# Patient Record
Sex: Female | Born: 1975 | Race: White | Hispanic: No | Marital: Married | Smoking: Current every day smoker
Health system: Southern US, Community
[De-identification: ages and names within clinical notes are randomized; demographics above are authoritative.]

## PROBLEM LIST (undated history)

## (undated) DIAGNOSIS — K219 Gastro-esophageal reflux disease without esophagitis: Secondary | ICD-10-CM

## (undated) DIAGNOSIS — E039 Hypothyroidism, unspecified: Secondary | ICD-10-CM

## (undated) DIAGNOSIS — I10 Essential (primary) hypertension: Secondary | ICD-10-CM

## (undated) DIAGNOSIS — F329 Major depressive disorder, single episode, unspecified: Secondary | ICD-10-CM

## (undated) DIAGNOSIS — G2581 Restless legs syndrome: Secondary | ICD-10-CM

## (undated) DIAGNOSIS — I509 Heart failure, unspecified: Secondary | ICD-10-CM

## (undated) DIAGNOSIS — E669 Obesity, unspecified: Secondary | ICD-10-CM

## (undated) DIAGNOSIS — G43909 Migraine, unspecified, not intractable, without status migrainosus: Secondary | ICD-10-CM

## (undated) DIAGNOSIS — O1490 Unspecified pre-eclampsia, unspecified trimester: Secondary | ICD-10-CM

## (undated) DIAGNOSIS — M87059 Idiopathic aseptic necrosis of unspecified femur: Secondary | ICD-10-CM

## (undated) DIAGNOSIS — F419 Anxiety disorder, unspecified: Secondary | ICD-10-CM

## (undated) DIAGNOSIS — M1990 Unspecified osteoarthritis, unspecified site: Secondary | ICD-10-CM

## (undated) DIAGNOSIS — F32A Depression, unspecified: Secondary | ICD-10-CM

## (undated) HISTORY — PX: BREAST LUMPECTOMY: SHX2

## (undated) HISTORY — DX: Gastro-esophageal reflux disease without esophagitis: K21.9

## (undated) HISTORY — DX: Hypothyroidism, unspecified: E03.9

## (undated) HISTORY — DX: Depression, unspecified: F32.A

## (undated) HISTORY — DX: Migraine, unspecified, not intractable, without status migrainosus: G43.909

## (undated) HISTORY — DX: Major depressive disorder, single episode, unspecified: F32.9

## (undated) HISTORY — DX: Essential (primary) hypertension: I10

## (undated) HISTORY — PX: DENTAL SURGERY: SHX609

## (undated) HISTORY — DX: Depression, unspecified: F41.9

## (undated) HISTORY — DX: Restless legs syndrome: G25.81

## (undated) HISTORY — PX: CHOLECYSTECTOMY: SHX55

## (undated) HISTORY — DX: Obesity, unspecified: E66.9

---

## 1998-03-01 ENCOUNTER — Emergency Department (HOSPITAL_COMMUNITY): Admission: EM | Admit: 1998-03-01 | Discharge: 1998-03-01 | Payer: Self-pay | Admitting: Emergency Medicine

## 2002-03-29 HISTORY — PX: GALLBLADDER SURGERY: SHX652

## 2004-07-16 ENCOUNTER — Emergency Department (HOSPITAL_COMMUNITY): Admission: EM | Admit: 2004-07-16 | Discharge: 2004-07-16 | Payer: Self-pay | Admitting: Emergency Medicine

## 2004-10-21 ENCOUNTER — Emergency Department (HOSPITAL_COMMUNITY): Admission: EM | Admit: 2004-10-21 | Discharge: 2004-10-21 | Payer: Self-pay | Admitting: Emergency Medicine

## 2011-12-03 DIAGNOSIS — G2581 Restless legs syndrome: Secondary | ICD-10-CM | POA: Insufficient documentation

## 2011-12-03 DIAGNOSIS — G43019 Migraine without aura, intractable, without status migrainosus: Secondary | ICD-10-CM | POA: Insufficient documentation

## 2012-08-07 ENCOUNTER — Telehealth: Payer: Self-pay | Admitting: Neurology

## 2012-08-07 NOTE — Telephone Encounter (Signed)
Called patient regarding her account and appointment. She stated she will call tomorrow to make a appointment.

## 2012-09-06 ENCOUNTER — Encounter: Payer: Self-pay | Admitting: Neurology

## 2012-09-06 DIAGNOSIS — G2581 Restless legs syndrome: Secondary | ICD-10-CM

## 2012-09-06 DIAGNOSIS — G43019 Migraine without aura, intractable, without status migrainosus: Secondary | ICD-10-CM

## 2012-09-07 ENCOUNTER — Encounter: Payer: Self-pay | Admitting: Neurology

## 2012-09-07 ENCOUNTER — Telehealth: Payer: Self-pay | Admitting: Neurology

## 2012-09-07 ENCOUNTER — Ambulatory Visit (INDEPENDENT_AMBULATORY_CARE_PROVIDER_SITE_OTHER): Payer: BC Managed Care – PPO | Admitting: Neurology

## 2012-09-07 VITALS — BP 105/76 | HR 86 | Ht 66.25 in | Wt 160.0 lb

## 2012-09-07 DIAGNOSIS — G43019 Migraine without aura, intractable, without status migrainosus: Secondary | ICD-10-CM

## 2012-09-07 DIAGNOSIS — G2581 Restless legs syndrome: Secondary | ICD-10-CM

## 2012-09-07 DIAGNOSIS — S139XXS Sprain of joints and ligaments of unspecified parts of neck, sequela: Secondary | ICD-10-CM

## 2012-09-07 MED ORDER — VENLAFAXINE HCL ER 75 MG PO CP24: 75.0000 mg | ORAL_CAPSULE | Freq: Two times a day (BID) | ORAL | Status: DC

## 2012-09-07 MED ORDER — CLONAZEPAM 1 MG PO TABS: 1.0000 mg | ORAL_TABLET | Freq: Three times a day (TID) | ORAL | Status: DC | PRN

## 2012-09-07 NOTE — Telephone Encounter (Signed)
I called the pharmacy and spoke with Pharmacist.  She said the patient just picked up #30 Klonopin 1mg  from her other doctor on 09/04/12.  If she is taking it TID, that would last until 06/19.  She said this patient has a history of trying to get Klonopin, Tramadol and Flexeril filled early.  Pharmacist also indicated they filled the Effexor, but the patient told them she did not want that, she only wanted the Klonopin.  They state patient got very irate with them because they told her they would not fill the medication.  I agreed, and told them we would not approve early refill.  I called the patient.  Advised her she needs to wait until Rx is due to get it refilled.  Told her we do not authorize early refills on controlled substances.  She verbalized understanding.

## 2012-09-07 NOTE — Progress Notes (Signed)
Reason for visit: Headache  Lindsey Griffin is an 37 y.o. female  History of present illness:  Lindsey Griffin is a 37 year old left-handed white female with a history of chronic daily headaches. The patient indicates that she continues to have ongoing headaches daily, and the headaches tend to come up from the neck and shoulders to the back of the head, and then spread forward. The patient has not had any days without headache since last seen. The patient continues to have significant issues with anxiety, and depression. The patient is having daily panic attacks, and the clonazepam dose was recently increased taking 1 mg 3 times daily. The patient is on Effexor taking 37.5 mg twice daily. The patient is not sleeping well at night. The patient does report a lot of neck stiffness and shoulder discomfort. The patient returns for an evaluation. The patient indicates that she is unable to function outside the house 25 days out of a month. The patient reports intermittent episodes of brief amnesia associated with the headache.  Past Medical History  Diagnosis Date  . Migraine headache   . Mild obesity   . Hypertension   . GERD (gastroesophageal reflux disease)   . Hypothyroidism   . RLS (restless legs syndrome)   . Anxiety and depression     Past Surgical History  Procedure Laterality Date  . Gallbladder surgery      Family History  Problem Relation Age of Onset  . Hypothyroidism Mother   . Headache Mother   . Hypertension Father   . Migraines Sister   . Stroke Brother     Social history:  reports that she has been smoking Cigarettes.  She has been smoking about 0.50 packs per day. She does not have any smokeless tobacco history on file. She reports that  drinks alcohol. She reports that she does not use illicit drugs.  Allergies: No Known Allergies  Medications:  Current Outpatient Prescriptions on File Prior to Visit  Medication Sig Dispense Refill  . acetaminophen (TYLENOL) 500 MG  tablet Take 500 mg by mouth every 6 (six) hours as needed for pain.      . Coenzyme Q10 (CO Q 10) 100 MG CAPS Take 100 capsules by mouth daily.      . fish oil-omega-3 fatty acids 1000 MG capsule Take 2 g by mouth daily.      Marland Kitchen gabapentin (NEURONTIN) 300 MG capsule Take 300 mg by mouth 3 (three) times daily.      Marland Kitchen levothyroxine (SYNTHROID, LEVOTHROID) 100 MCG tablet Take 100 mcg by mouth daily before breakfast.      . Magnesium Oxide 250 MG TABS Take 250 tablets by mouth daily.      . naproxen sodium (ANAPROX) 220 MG tablet Take 220 mg by mouth 2 (two) times daily with a meal.      . omeprazole (PRILOSEC) 20 MG capsule Take 20 mg by mouth daily.      . promethazine (PHENERGAN) 25 MG tablet Take 25 mg by mouth 2 (two) times daily.      . Riboflavin (B-2-400) 400 MG CAPS Take 400 capsules by mouth daily.      . rizatriptan (MAXALT) 10 MG tablet Take 10 mg by mouth as needed for migraine. May repeat in 2 hours if needed      . traMADol (ULTRAM) 50 MG tablet Take 50 mg by mouth every 6 (six) hours as needed for pain.      . valsartan-hydrochlorothiazide (DIOVAN-HCT) 160-25 MG per tablet  Take 1 tablet by mouth daily.       No current facility-administered medications on file prior to visit.    ROS:  Out of a complete 14 system review of symptoms, the patient complains only of the following symptoms, and all other reviewed systems are negative.  Weight gain, fatigue Palpitations of the heart Hearing loss, ringing in the ears, dizziness Itching Blurred vision, double vision, loss of vision Shortness of breath, cough, wheezing, snoring Diarrhea Urination problems Memory loss, confusion, headache, weakness, slurred speech Depression, anxiety, insomnia, change in appetite, just interest in activities Restless legs  Blood pressure 105/76, pulse 86, height 5' 6.25" (1.683 m), weight 160 lb (72.576 kg).  Physical Exam  General: The patient is alert and cooperative at the time of the  examination.  Skin: No significant peripheral edema is noted.   Neurologic Exam  Cranial nerves: Facial symmetry is present. Speech is normal, no aphasia or dysarthria is noted. Extraocular movements are full. Visual fields are full.  Motor: The patient has good strength in all 4 extremities.  Coordination: The patient has good finger-nose-finger and heel-to-shin bilaterally.  Gait and station: The patient has a normal gait. Tandem gait is normal. Romberg is negative. No drift is seen.  Reflexes: Deep tendon reflexes are symmetric.   Assessment/Plan:  1. Depression, panic disorder  2. Chronic daily headache  The patient primarily has psychiatric disease associated with depression and anxiety. The chronic daily headaches are a manifestation of her underlying psychiatric problems. The patient has been increased on the clonazepam taking 1 mg 3 times daily. The patient will be increased on the Effexor taking 75 mg twice daily. The patient may require a psychiatric evaluation if the headaches do not improve along with anxiety and her panic disorder. The patient will followup in 4 months. The patient in the past has not responded to Botox. The patient will be set up for neuromuscular therapy on the neck and shoulders.  Marlan Palau MD 09/07/2012 5:03 PM  [facility] [address] [place], [place] [address]  Phone [phone] Fax [phone]

## 2012-09-08 DIAGNOSIS — Z0289 Encounter for other administrative examinations: Secondary | ICD-10-CM

## 2012-12-29 ENCOUNTER — Other Ambulatory Visit: Payer: Self-pay | Admitting: Neurology

## 2013-01-01 ENCOUNTER — Other Ambulatory Visit: Payer: Self-pay

## 2013-01-01 MED ORDER — CLONAZEPAM 1 MG PO TABS: 1.0000 mg | ORAL_TABLET | Freq: Three times a day (TID) | ORAL | Status: DC | PRN

## 2013-01-08 ENCOUNTER — Ambulatory Visit: Payer: BC Managed Care – PPO | Admitting: Nurse Practitioner

## 2013-01-26 ENCOUNTER — Ambulatory Visit: Payer: BC Managed Care – PPO | Admitting: Nurse Practitioner

## 2013-02-08 ENCOUNTER — Ambulatory Visit: Payer: BC Managed Care – PPO | Admitting: *Deleted

## 2013-02-08 ENCOUNTER — Encounter: Payer: Self-pay | Admitting: Nurse Practitioner

## 2013-02-08 ENCOUNTER — Ambulatory Visit (INDEPENDENT_AMBULATORY_CARE_PROVIDER_SITE_OTHER): Payer: BC Managed Care – PPO | Admitting: Nurse Practitioner

## 2013-02-08 ENCOUNTER — Encounter (INDEPENDENT_AMBULATORY_CARE_PROVIDER_SITE_OTHER): Payer: Self-pay

## 2013-02-08 VITALS — BP 111/80 | HR 120 | Ht 67.0 in | Wt 157.0 lb

## 2013-02-08 DIAGNOSIS — G2581 Restless legs syndrome: Secondary | ICD-10-CM

## 2013-02-08 DIAGNOSIS — G43019 Migraine without aura, intractable, without status migrainosus: Secondary | ICD-10-CM

## 2013-02-08 MED ORDER — SODIUM CHLORIDE 0.9 % IV SOLN
250.0000 mg | INTRAVENOUS | Status: AC
  Administered 2013-02-08: 250 mg via INTRAVENOUS

## 2013-02-08 MED ORDER — SODIUM CHLORIDE 0.9 % IV SOLN: 250.0000 mg | INTRAVENOUS | Status: DC

## 2013-02-08 MED ORDER — PREDNISONE 10 MG PO TABS: 10.0000 mg | ORAL_TABLET | Freq: Every day | ORAL | Status: DC

## 2013-02-08 MED ORDER — PROMETHAZINE HCL 25 MG/ML IJ SOLN
25.0000 mg | Freq: Once | INTRAMUSCULAR | Status: AC
  Administered 2013-02-08: 25 mg via INTRAMUSCULAR

## 2013-02-08 MED ORDER — SODIUM CHLORIDE 0.9 % IV SOLN
500.0000 mL | INTRAVENOUS | Status: DC
  Administered 2013-02-08: 500 mL via INTRAVENOUS

## 2013-02-08 NOTE — Patient Instructions (Signed)
Will give IV fluids today Will given Phenergan IM for nausea Will give 250mg  Solu Medrol IV Will call in dose pack of Prednisone Will refer to Dr. Pearson Grippe at [facility]

## 2013-02-08 NOTE — Progress Notes (Signed)
[facility]  PATIENT: Lindsey Griffin DOB: 1975-04-14   REASON FOR VISIT: Migraines for 5 days / nausea vomiting   HISTORY OF PRESENT ILLNESS:Lindsey Griffin, 37 year old female with a history of chronic daily headaches returns for followup. She was last seen in this office 09/07/2012 by Dr. Anne Hahn and has been followed by him for several years for headache. The patient has tried multiple preventatives in the past. The patient indicates that she continues to have ongoing headaches daily, and the headaches tend to come up from the neck and shoulders to the back of the head, and then spread forward. The patient has had a few  days without headache since last seen. The patient continues to have significant issues with anxiety, and depression. She has never had any counseling .The patient is having  panic attacks, and is on  clonazepam . The patient is on Effexor taking 75mg  twice daily. The patient is not sleeping well at night.  The patient indicates that she is unable to function outside the house 25 days out of a month. She has been on Topamax, Cymbalta, atenolol, amitriptyline, Depakote, Botox, Imitrex injections, Treximet, Zomig, Seroquel and Depacon with no benefit. She is vomiting clear liquid   REVIEW OF SYSTEMS: Full 14 system review of systems performed and notable only for:  Constitutional: N/A  Cardiovascular: N/A  Ear/Nose/Throat: N/A  Skin: N/A  Eyes: N/A  Respiratory: N/A  Gastroitestinal: N/A  Hematology/Lymphatic: N/A  Endocrine: N/A Musculoskeletal:N/A  Allergy/Immunology: N/A  Neurological: N/A Psychiatric: N/A   ALLERGIES: No Known Allergies  HOME MEDICATIONS: Outpatient Prescriptions Prior to Visit  Medication Sig Dispense Refill  . acetaminophen (TYLENOL) 500 MG tablet Take 500 mg by mouth every 6 (six) hours as needed for pain.      . clonazePAM (KLONOPIN) 1 MG tablet Take 1 tablet (1 mg total) by mouth 3 (three) times daily as needed.  90 tablet   3  . Coenzyme Q10 (CO Q 10) 100 MG CAPS Take 100 capsules by mouth daily.      Marland Kitchen gabapentin (NEURONTIN) 300 MG capsule Take 300 mg by mouth 3 (three) times daily.      Marland Kitchen levothyroxine (SYNTHROID, LEVOTHROID) 100 MCG tablet Take 100 mcg by mouth daily before breakfast.      . Magnesium Oxide 250 MG TABS Take 250 tablets by mouth daily.      . naproxen sodium (ANAPROX) 220 MG tablet Take 220 mg by mouth 2 (two) times daily with a meal.      . promethazine (PHENERGAN) 25 MG tablet Take 25 mg by mouth 2 (two) times daily.      . Riboflavin (B-2-400) 400 MG CAPS Take 400 capsules by mouth daily.      . rizatriptan (MAXALT) 10 MG tablet Take 10 mg by mouth as needed for migraine. May repeat in 2 hours if needed      . traMADol (ULTRAM) 50 MG tablet Take 50 mg by mouth every 6 (six) hours as needed for pain.      . valsartan-hydrochlorothiazide (DIOVAN-HCT) 160-25 MG per tablet Take 1 tablet by mouth daily.      Marland Kitchen venlafaxine XR (EFFEXOR-XR) 75 MG 24 hr capsule Take 1 capsule (75 mg total) by mouth 2 (two) times daily.  60 capsule  5  . fish oil-omega-3 fatty acids 1000 MG capsule Take 2 g by mouth daily.      Marland Kitchen omeprazole (PRILOSEC) 20 MG capsule Take 20 mg by mouth daily.  No facility-administered medications prior to visit.    PAST MEDICAL HISTORY: Past Medical History  Diagnosis Date  . Migraine headache   . Mild obesity   . Hypertension   . GERD (gastroesophageal reflux disease)   . Hypothyroidism   . RLS (restless legs syndrome)   . Anxiety and depression     PAST SURGICAL HISTORY: Past Surgical History  Procedure Laterality Date  . Gallbladder surgery      FAMILY HISTORY: Family History  Problem Relation Age of Onset  . Hypothyroidism Mother   . Headache Mother   . Hypertension Father   . Migraines Sister   . Stroke Brother     SOCIAL HISTORY: History   Social History  . Marital Status: Divorced    Spouse Name: N/A    Number of Children: 1  . Years of  Education: 14   Occupational History  . Unemployed    Social History Main Topics  . Smoking status: Current Every Day Smoker -- 0.50 packs/day    Types: Cigarettes  . Smokeless tobacco: Never Used  . Alcohol Use: Yes     Comment: Consumes 2 or 3 alcholic beverages per week  . Drug Use: No  . Sexual Activity: Not on file   Other Topics Concern  . Not on file   Social History Narrative  . No narrative on file     PHYSICAL EXAM  Filed Vitals:   02/08/13 1020  BP: 111/80  Pulse: 120  Height: 5\' 7"  (1.702 m)  Weight: 157 lb (71.215 kg)   Body mass index is 24.58 kg/(m^2).  Generalized: Well developed, in no acute distress  Head: normocephalic and atraumatic,. Oropharynx benign  Musculoskeletal: No deformity   Neurological examination   Mentation: Alert oriented to time, place, history taking. Follows all commands speech and language fluent  Cranial nerve II-XII: Fundoscopic exam deferred.Pupils were equal round reactive to light extraocular movements were full, visual field were full on confrontational test. Facial sensation and strength were normal. hearing was intact to finger rubbing bilaterally. Uvula tongue midline. head turning and shoulder shrug and were normal and symmetric.Tongue protrusion into cheek strength was normal. Motor: normal bulk and tone, full strength in the BUE, BLE, fine finger movements normal, no pronator drift. No focal weakness Coordination: finger-nose-finger,  no dysmetria Reflexes: Brachioradialis 2/2, biceps 2/2, triceps 2/2, patellar 2/2, Achilles 2/2, plantar responses were flexor bilaterally. Gait and Station: Not ambulated, vomiting.   DIAGNOSTIC DATA (LABS, IMAGING, TESTING) -None to review ASSESSMENT AND PLAN  37 y.o. year old female  has a past medical history of Migraine headache; Mild obesity; Hypertension; GERD (gastroesophageal reflux disease); Hypothyroidism; RLS (restless legs syndrome); and Anxiety and depression. here to  followup for severe migraine for 5 days with nausea and vomiting. She has been on Topamax, Cymbalta, atenolol, amitriptyline, Depakote, Botox, Imitrex injections, Treximet, Zomig, Seroquel and Depacon with no benefit.  Discussed with Dr. Anne Hahn Will give IV fluids today, 1000cc NS Will given Phenergan IM for nausea Will give 250mg  Solu Medrol IV Will call in dose pack of Prednisone 6day  Stop OTC meds.  Will refer to Dr. Pearson Grippe at [facility] Made aware she needs to seek mental health counseling in her county [place] [place], [facility], [facility], [facility]  [facility] [address] [place], [place] [address] [phone]

## 2013-02-08 NOTE — Progress Notes (Signed)
I have read the note, and I agree with the clinical assessment and plan.  WILLIS,CHARLES KEITH   

## 2013-02-08 NOTE — Progress Notes (Signed)
Pt here for appt.  Darrol Angel, NP ordered IVF 0.9% NS Bolus, Phenergan 25mg  IM, Solumedrol 250mg  IV for treatment of migraine.  IV 24g angiocath inserted to R forearm with good blood return, would not flow, taken out, 2nd attempt to L outer AC with good blood return infusion NS started , patent .  Gave phenergan 25mg  IM to L gluteal at 1137,  bandaid applied pt tolerated well.   Pt made comfortable, reclining in chair, lights dimmed, blanket applied, husband brought to treatment room  at 1149.  IV solumedrol 250mg  given at 1200.  Informed of possible SE.  At 1223 pt asleep,  Pt instructed to keep arm straight, repositioned for IV flow.  At 1238, pt asleep , IV patent.  1249 Pt and husband asleep, IV patent.  1258 IV patent, both pt and husband asleep.  1319 IV finished, IV discontinued.  Bandaid applied after catheter removal and pressure applied.  Vs 102/64, HR 100.  Pt groggy, accompanied pt to car in wheelchair. Husband driving.  Pt stated head still pounding.  Assisted pt to car, seat belt placed and pt reclining in car.  She was informed re: if needed tramadol to contact pcp, whom she gets prescription from.

## 2013-05-02 ENCOUNTER — Other Ambulatory Visit: Payer: Self-pay | Admitting: Neurology

## 2013-05-02 NOTE — Telephone Encounter (Signed)
Rx signed and faxed.

## 2013-06-29 ENCOUNTER — Encounter: Payer: Self-pay | Admitting: *Deleted

## 2013-07-02 ENCOUNTER — Encounter: Payer: Self-pay | Admitting: Neurology

## 2013-07-02 ENCOUNTER — Encounter (INDEPENDENT_AMBULATORY_CARE_PROVIDER_SITE_OTHER): Payer: Self-pay

## 2013-07-02 ENCOUNTER — Ambulatory Visit (INDEPENDENT_AMBULATORY_CARE_PROVIDER_SITE_OTHER): Payer: BC Managed Care – PPO | Admitting: Neurology

## 2013-07-02 VITALS — BP 118/87 | HR 92 | Resp 15 | Ht 67.0 in | Wt 159.0 lb

## 2013-07-02 DIAGNOSIS — R0989 Other specified symptoms and signs involving the circulatory and respiratory systems: Secondary | ICD-10-CM

## 2013-07-02 DIAGNOSIS — R0683 Snoring: Secondary | ICD-10-CM

## 2013-07-02 DIAGNOSIS — G43019 Migraine without aura, intractable, without status migrainosus: Secondary | ICD-10-CM

## 2013-07-02 DIAGNOSIS — F411 Generalized anxiety disorder: Secondary | ICD-10-CM

## 2013-07-02 DIAGNOSIS — R0609 Other forms of dyspnea: Secondary | ICD-10-CM

## 2013-07-02 DIAGNOSIS — G4720 Circadian rhythm sleep disorder, unspecified type: Secondary | ICD-10-CM

## 2013-07-02 NOTE — Progress Notes (Addendum)
[facility]  Provider:  Melvyn Novasarmen  Courtenay Creger, M D  Referring Provider: Kirstie PeriShah, Ashish, MD Primary Care Physician:  Kirstie PeriSHAH,ASHISH, MD    HPI:  Lindsey Griffin is a 38 y.o. caucasian , right handed , married female . She  is seen here upon a referral from Dr.Finkel , for a sleep evaluation . The consult is meant to rule out sleep disorders as a possible contributing factor to her chronic daily migraines.   The patient is seen here today upon referral by Dr. Wandra ArthursAlan Finkel or had last seen the patient on 06-09-13. Is not state that the patient has a total headache per month of 28 of 30 days severe headaches of 18 days moderate headaches 5-10 days. Days without headache last months at best 2 days. Her symptoms have been associated with nausea vomiting photophobia, phonophobia, osmophobia, neck pain, dizziness she also feels that her headache is worsening with any activity. A headache at Last 24 hours or up to 24 hours. The headache is described as localized in both sides of the bowel in the frontal area in the back of the head and portal cephalic. Throbbing pressure and tightness are descriptive qualities. Again the symptoms are aggravated by Smeltz, bright light, standing, menstrual cycle lower noises blocking exercise, and stressful situations. Dr. Marshell GarfinkelFinkel saw the patient upon request of Dr. Anne HahnWillis.  Lindsey Griffin states,  she can recall severe  headaches as far as 5 or 6 years back . She would often vomit ond have photo- sensitivities , auras  prior to the headache worsening. In 2008 she noted a radical increase and worsening of the vomiting which eventually led to her current disability.  She underwent a desensitization for food triggers  with improvement for about 1 year,  but 7 years ago it all began to come apart and what used to be a headache every couple of months has become a monthly then weekly and now almost daily events. She was again seen by Dr. Anne HahnWillis at that time.  The patient's family  history is remarkable for headache and her mother sisters and an her daughter, family history is also remarkable for stroke in a brother at age 37.   In addition the patient has now  circadian rhythm disorder . Characterized as the  reversal of sleep into the day from previously nighttime sleep.  She states that since being on disability this has been slowly evolving. She goes to bed only when tired , about 6 AM after her husband leaves for work. Her legs move a lot and she fears this would wake him. She takes a hot bath before bedtime, and she falls asleep after 30- 45 minutes. She cannot stay asleep, goes every hour to the bathroom and up to 5 times at night. Total sleep time during a 24 hour period is 4-5 hours. She wakes up in the early afternoon, 1 -2 PM, she uses no alarm. Her husband gets hone with their daughter at about 3 Pm and she is awake by then.  Her headaches wake her out of sleep .      She also notices it sometimes talks in her sleep she also reports restless limb movements in spite of taking Mirapex. She has a history of using gabapentin and clonazepam for anxiety and panic attack.  Review of Systems: Out of a complete 14 system review, the patient complains of only the following symptoms, and all other reviewed systems are negative.  Her review of system is positive for  a life quality impairment due to restless legs endorsed at 8-9/10 points on the [facility] questionnaire, a fatigue severity score of 54 points and an Epworth sleepiness score of 5 points. The use of systems as obtained by Dr. Marshell Garfinkel to 3 weeks ago still applies, appetite loss fatigue and weight gain, blurred vision visual loss double vision, shortness of breath, abdominal pain, nausea, muscle cramps, personality change, depression, anxiety, heat intolerance, night sweats.      History   Social History  . Marital Status: Married    Spouse Name: Lindsey Griffin    Number of Children: 1  . Years of Education: 14    Occupational History  . Unemployed    Social History Main Topics  . Smoking status: Current Every Day Smoker -- 0.50 packs/day    Types: Cigarettes  . Smokeless tobacco: Never Used  . Alcohol Use: Yes     Comment: Consumes 2 or 3 alcholic beverages per month  . Drug Use: No  . Sexual Activity: Not on file   Other Topics Concern  . Not on file   Social History Narrative   Patient is married Dance movement psychotherapist)    Patient has a Naval architect.   Patient is disabled.   Patient has one child.   Patient is left-handed.   Patient does not drink any caffeine.             Family History  Problem Relation Age of Onset  . Headache Mother   . Hypertension Father   . Migraines Sister   . Stroke Brother   . Hyperthyroidism Mother   . Hypercholesterolemia Father   . Heart attack Father   . Asthma Father   . COPD Mother   . Hypertension Mother   . Uterine cancer Mother   . Hypothyroidism Sister   . Hypertension Sister   . Hypertension Sister   . Fibroids Sister   . Bipolar disorder Sister   . Paranoid behavior Sister   . Fibroids Sister     Past Medical History  Diagnosis Date  . Migraine headache   . Mild obesity   . Hypertension   . GERD (gastroesophageal reflux disease)   . Hypothyroidism   . RLS (restless legs syndrome)   . Anxiety and depression     Past Surgical History  Procedure Laterality Date  . Gallbladder surgery  2004    Current Outpatient Prescriptions  Medication Sig Dispense Refill  . acetaminophen (TYLENOL) 500 MG tablet Take 500 mg by mouth every 6 (six) hours as needed for pain.      . clonazePAM (KLONOPIN) 1 MG tablet take 1 tablet by mouth three times a day if needed for anxiety  90 tablet  3  . Coenzyme Q10 (CO Q 10) 100 MG CAPS Take 100 capsules by mouth daily.      . famotidine (PEPCID) 10 MG tablet Take 10 mg by mouth 2 (two) times daily.      Marland Kitchen levothyroxine (SYNTHROID, LEVOTHROID) 100 MCG tablet Take 100 mcg by mouth daily before  breakfast.      . Magnesium Oxide 250 MG TABS Take 250 tablets by mouth daily.      . mirtazapine (REMERON) 15 MG tablet Take 15 mg by mouth at bedtime.      . naproxen sodium (ANAPROX) 220 MG tablet Take 220 mg by mouth 2 (two) times daily with a meal.      . ondansetron (ZOFRAN) 8 MG tablet Take by mouth every 8 (eight) hours  as needed for nausea or vomiting.      . promethazine (PHENERGAN) 25 MG tablet Take 25 mg by mouth 2 (two) times daily.      . Riboflavin (B-2-400) 400 MG CAPS Take 400 capsules by mouth daily.      . rizatriptan (MAXALT) 10 MG tablet Take 10 mg by mouth as needed for migraine. May repeat in 2 hours if needed      . traMADol (ULTRAM) 50 MG tablet Take 50 mg by mouth every 6 (six) hours as needed for pain.      . valsartan-hydrochlorothiazide (DIOVAN-HCT) 160-25 MG per tablet Take 1 tablet by mouth daily.       Current Facility-Administered Medications  Medication Dose Route Frequency Provider Last Rate Last Dose  . 0.9 %  sodium chloride infusion  500 mL Intravenous Continuous York Spaniel, MD   500 mL at 02/08/13 1130  . methylPREDNISolone sodium succinate (SOLU-MEDROL) 250 mg in sodium chloride 0.9 % 100 mL IVPB  250 mg Intravenous Continuous York Spaniel, MD   250 mg at 02/08/13 1200    Allergies as of 07/02/2013 - Review Complete 07/02/2013  Allergen Reaction Noted  . Dust mite extract  06/29/2013  . Peanuts [peanut oil]  06/29/2013  . Pollen extract  06/29/2013  . Topamax [topiramate]  06/29/2013    Vitals: BP 118/87  Pulse 92  Resp 15  Ht 5\' 7"  (1.702 m)  Wt 159 lb (72.122 kg)  BMI 24.90 kg/m2  LMP 06/01/2013 Last Weight:  Wt Readings from Last 1 Encounters:  07/02/13 159 lb (72.122 kg)   Last Height:   Ht Readings from Last 1 Encounters:  07/02/13 5\' 7"  (1.702 m)   Physical exam:  General: The patient is awake, alert and appears not in acute distress. The patient is well groomed. She wears sunglasses .  Head: Normocephalic,  atraumatic. Neck is supple. Mallampati 2 , neck circumference: 15.5 ,  TMJ clicking   Cardiovascular:  Regular rate and rhythm , without  murmurs or carotid bruit, and without distended neck veins. Respiratory: Lungs are clear to auscultation. Skin:  Without evidence of edema, or rash Trunk: BMI is normal .  Neurologic exam : The patient is awake and alert, oriented to place and time.  Memory subjective  described as intact. There is a normal attention span & concentration ability. Speech is fluent without  dysarthria, dysphonia or aphasia. Mood and affect are depressed.  Cranial nerves: Pupils are equal and briskly reactive to light. Funduscopic exam without evidence of pallor or edema. Extraocular movements  in vertical and horizontal planes intact and without nystagmus. Visual fields by finger perimetry are intact. Hearing to finger rub intact.  Facial sensation intact to fine touch. Facial motor strength is symmetric and tongue and uvula move midline.  Motor exam:  Normal tone , muscle bulk and symmetric normal strength in all extremities.  Coordination: Rapid alternating movements in the fingers/hands is tested and normal. Finger-to-nose maneuver without evidence of ataxia, dysmetria or tremor.  Gait and station: Patient walks without assistive device .  Assessment:  After physical and neurologic examination, review of laboratory studies, imaging, neurophysiology testing and pre-existing records, assessment is : circadian rhytm disorder , possibly in context with a mood disorder, poor sleep hygiene and medication use. Psychological factors are prominent .  Plan:  Treatment plan and additional workup :  I will offer this patient a daytime study to accommodate her current sleep rhythm, and to get any sleep  recording.  Given the long standing history of daily headaches a sleep disorder as the cause of her migraines and headaches.  CO2 recording needed. SPLIT at AHI 15 and 3% score for  patient on  BCBS / disability.   She will bring her acid reducer and Extra pillows ,  As well as her sleep aid of choice ( benadryl)  will be brought to the lab by the patient.    Addendum : last note by Dr Anne Hahn. Nov 2014.  Reason for visit: Headache  History of present illness:Lindsey Griffin is a 38 year old left-handed white female with a history of chronic daily headaches. The patient indicates that she continues to have ongoing headaches daily, and the headaches tend to come up from the neck and shoulders to the back of the head, and then spread forward. The patient has not had any days without headache since last seen. The patient continues to have significant issues with anxiety, and depression. The patient is having daily panic attacks, and the clonazepam dose was recently increased taking 1 mg 3 times daily. The patient is on Effexor taking 37.5 mg twice daily. The patient is not sleeping well at night. The patient does report a lot of neck stiffness and shoulder discomfort. The patient returns for an evaluation. The patient indicates that she is unable to function outside the house 25 days out of a month. The patient reports intermittent episodes of brief amnesia associated with the headache.  Past Medical History  Diagnosis Date  . Migraine headache   . Mild obesity   . Hypertension   . GERD (gastroesophageal reflux disease)   . Hypothyroidism   . RLS (restless legs syndrome)   . Anxiety and depression      Social history:  reports that she has been smoking Cigarettes.  She has been smoking about 0.50 packs per day. She has never used smokeless tobacco. She reports that she drinks alcohol. She reports that she does not use illicit drugs.   Blood pressure 118/87, pulse 92, resp. rate 15, height 5\' 7"  (1.702 m), weight 159 lb (72.122 kg), last menstrual period 06/01/2013.  Physical Exam  General: The patient is alert and cooperative at the time of the examination.  Skin: No  significant peripheral edema is noted.   Neurologic Exam Cranial nerves: Facial symmetry is present. Speech is normal, no aphasia or dysarthria is noted. Extraocular movements are full. Visual fields are full. Motor: The patient has good strength in all 4 extremities. Coordination: The patient has good finger-nose-finger and heel-to-shin bilaterally. Gait and station: The patient has a normal gait. Tandem gait is normal. Romberg is negative. No drift is seen. Reflexes: Deep tendon reflexes are symmetric.   Assessment/Plan:  1. Depression, panic disorder 2. Chronic daily headache The patient primarily has psychiatric disease associated with depression and anxiety. The chronic daily headaches are a manifestation of her underlying psychiatric problems. The patient has been increased on the clonazepam taking 1 mg 3 times daily. The patient will be increased on the Effexor taking 75 mg twice daily. The patient may require a psychiatric evaluation if the headaches do not improve along with anxiety and her panic disorder. The patient will followup in 4 months. The patient in the past has not responded to Botox. The patient will be set up for neuromuscular therapy on the neck and shoulders.  Marlan Palau MD  [facility] [address] [place], [place] [address]  Phone [phone] Fax [phone]

## 2013-07-02 NOTE — Patient Instructions (Signed)
Headaches, Analgesic Rebound Analgesic agents are prescription or over-the-counter medications used to control pain, including headaches. However, overuse or misuse of theses medications can lead to rebound headaches. Rebound headaches are headaches that recur after the analgesic medication wears off. Eventually, the rebound headaches can become longlasting (chronic). If this happens, you must completely stop using analgesic medications. If not, the chronic headache is likely to continue despite the use of any other treatment. Usually when you stop taking analgesic medications, the headache may initally get worse for several days. Along with this you may experience sickness in your stomach (nausea), and you may throw up (vomit). After a period of 3 to 5 days, these symptoms begin to improve. Sometimes improvement may take longer. Eventually, the headaches will slowly improve with treatment with the right medications. Most people are able to stop using analgesic medications at home with a caregiver's supervision. But some find it difficult and may require hospitalization. Document Released: 06/05/2003 Document Revised: 06/07/2011 Document Reviewed: 09/28/2012 [employer] Patient Information 2014 [employer].  

## 2013-08-23 ENCOUNTER — Other Ambulatory Visit: Payer: Self-pay

## 2013-08-23 MED ORDER — CLONAZEPAM 1 MG PO TABS: ORAL_TABLET | ORAL | Status: DC

## 2013-08-23 NOTE — Telephone Encounter (Signed)
Rx signed and faxed.

## 2014-06-14 ENCOUNTER — Ambulatory Visit (INDEPENDENT_AMBULATORY_CARE_PROVIDER_SITE_OTHER): Payer: 59 | Admitting: Psychiatry

## 2014-06-14 ENCOUNTER — Encounter (HOSPITAL_COMMUNITY): Payer: Self-pay | Admitting: Psychiatry

## 2014-06-14 VITALS — BP 113/89 | HR 69 | Ht 66.0 in | Wt 152.4 lb

## 2014-06-14 DIAGNOSIS — F4310 Post-traumatic stress disorder, unspecified: Secondary | ICD-10-CM | POA: Diagnosis not present

## 2014-06-14 MED ORDER — CLONAZEPAM 1 MG PO TABS: 1.0000 mg | ORAL_TABLET | Freq: Three times a day (TID) | ORAL | Status: DC

## 2014-06-14 MED ORDER — PRAZOSIN HCL 5 MG PO CAPS: 5.0000 mg | ORAL_CAPSULE | Freq: Every day | ORAL | Status: DC

## 2014-06-14 MED ORDER — SUVOREXANT 20 MG PO TABS: 20.0000 mg | ORAL_TABLET | Freq: Every day | ORAL | Status: DC
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
## 2014-06-26 ENCOUNTER — Telehealth (HOSPITAL_COMMUNITY): Payer: Self-pay | Admitting: *Deleted

## 2014-06-26 NOTE — Telephone Encounter (Signed)
Per Dr. Tenny Crawoss, she will not be able to increase pt medication until she see pt in at sooner appt. Called pt and made appt with pt. Offered pt sooner appt but pt did not want those times and date due to her husband have to bring her and her not having transportation. Per pt she will come in April 4th. Pt is aware that Dr. Tenny Crawoss is also unable to make increase her medications until she is seen and pt agreed and showed understanding

## 2014-06-26 NOTE — Telephone Encounter (Signed)
phone call from patient wants Klonopin increased, she keeps thinking about dying.    She will run out pretty soon.

## 2014-06-26 NOTE — Telephone Encounter (Signed)
Please call patient to be seen sooner. I cannot refill controlled meds early

## 2014-07-01 ENCOUNTER — Ambulatory Visit (HOSPITAL_COMMUNITY): Payer: Self-pay | Admitting: Psychiatry

## 2014-07-04 ENCOUNTER — Ambulatory Visit (INDEPENDENT_AMBULATORY_CARE_PROVIDER_SITE_OTHER): Payer: 59 | Admitting: Psychiatry

## 2014-07-04 ENCOUNTER — Encounter (HOSPITAL_COMMUNITY): Payer: Self-pay | Admitting: Psychiatry

## 2014-07-04 VITALS — BP 140/90 | Ht 67.0 in | Wt 152.0 lb

## 2014-07-04 DIAGNOSIS — F4310 Post-traumatic stress disorder, unspecified: Secondary | ICD-10-CM

## 2014-07-04 MED ORDER — CLONAZEPAM 1 MG PO TABS: 1.0000 mg | ORAL_TABLET | Freq: Three times a day (TID) | ORAL | Status: DC
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
## 2014-07-08 ENCOUNTER — Ambulatory Visit (HOSPITAL_COMMUNITY): Payer: Self-pay | Admitting: Psychiatry

## 2014-07-09 ENCOUNTER — Ambulatory Visit (HOSPITAL_COMMUNITY): Payer: Self-pay | Admitting: Psychiatry

## 2014-07-29 ENCOUNTER — Ambulatory Visit (HOSPITAL_COMMUNITY): Payer: Self-pay | Admitting: Psychiatry

## 2014-07-30 ENCOUNTER — Ambulatory Visit (HOSPITAL_COMMUNITY): Payer: Self-pay | Admitting: Psychiatry

## 2014-08-14 ENCOUNTER — Ambulatory Visit (HOSPITAL_COMMUNITY): Payer: Self-pay | Admitting: Psychiatry

## 2014-08-20 ENCOUNTER — Ambulatory Visit (HOSPITAL_COMMUNITY): Payer: Self-pay | Admitting: Psychiatry

## 2014-09-11 ENCOUNTER — Encounter (HOSPITAL_COMMUNITY): Payer: Self-pay | Admitting: Psychiatry

## 2014-09-11 ENCOUNTER — Ambulatory Visit (INDEPENDENT_AMBULATORY_CARE_PROVIDER_SITE_OTHER): Payer: 59 | Admitting: Psychiatry

## 2014-09-11 VITALS — BP 90/66 | HR 92 | Ht 67.0 in | Wt 150.4 lb

## 2014-09-11 DIAGNOSIS — F4310 Post-traumatic stress disorder, unspecified: Secondary | ICD-10-CM | POA: Diagnosis not present

## 2014-09-11 MED ORDER — CLONAZEPAM 1 MG PO TABS: 1.0000 mg | ORAL_TABLET | Freq: Three times a day (TID) | ORAL | Status: DC

## 2014-09-11 MED ORDER — SUVOREXANT 20 MG PO TABS: 20.0000 mg | ORAL_TABLET | ORAL | Status: DC | PRN
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
## 2014-09-17 ENCOUNTER — Ambulatory Visit (HOSPITAL_COMMUNITY): Payer: Self-pay | Admitting: Psychiatry

## 2014-10-03 ENCOUNTER — Telehealth (HOSPITAL_COMMUNITY): Payer: Self-pay | Admitting: *Deleted

## 2014-10-03 NOTE — Telephone Encounter (Signed)
Prior authorization for Belsomra received. Submitted online with cover my meds. Decision will be faxed.

## 2014-10-08 ENCOUNTER — Telehealth (HOSPITAL_COMMUNITY): Payer: Self-pay | Admitting: *Deleted

## 2014-10-08 NOTE — Telephone Encounter (Signed)
FAX DENIAL FOR BELSOMRA.

## 2014-10-10 NOTE — Telephone Encounter (Signed)
This patient has severe migraine headaches with other meds and this is the one she can tolerate

## 2014-10-10 NOTE — Telephone Encounter (Signed)
Belsomra denied due to not trying two of the following: Zolpidem, Zaleplon, Eszopiclone. Will ask MD if she would like to proceed with appeal if new information is available or try a different medication.

## 2014-10-14 ENCOUNTER — Telehealth (HOSPITAL_COMMUNITY): Payer: Self-pay

## 2014-10-14 ENCOUNTER — Other Ambulatory Visit (HOSPITAL_COMMUNITY): Payer: Self-pay | Admitting: Psychiatry

## 2014-10-14 MED ORDER — TEMAZEPAM 15 MG PO CAPS: 15.0000 mg | ORAL_CAPSULE | Freq: Every evening | ORAL | Status: DC | PRN

## 2014-10-14 NOTE — Telephone Encounter (Signed)
restoril substituted, she will pick up

## 2014-10-18 ENCOUNTER — Telehealth (HOSPITAL_COMMUNITY): Payer: Self-pay | Admitting: *Deleted

## 2014-10-18 NOTE — Telephone Encounter (Signed)
New information from MD given that patient has tried several other medications that cause severe migraines. Called to do appeal on Belsomra and was given approval [phone] good until 10/18/15 per Gene. They advise to wait until approval letter is faxed to notify the pharmacy of approval since it must go through the appeal department first.

## 2014-10-23 ENCOUNTER — Telehealth (HOSPITAL_COMMUNITY): Payer: Self-pay | Admitting: *Deleted

## 2014-10-23 NOTE — Telephone Encounter (Signed)
Pt came into office to pick up her printed script that was printed on 10-14-14. Pt D/L number is [id number] with expiration of 05-10-19. Pt agreed with printed script.

## 2014-11-05 ENCOUNTER — Ambulatory Visit (HOSPITAL_COMMUNITY): Payer: Self-pay | Admitting: Psychology

## 2014-11-08 ENCOUNTER — Ambulatory Visit (HOSPITAL_COMMUNITY): Payer: Self-pay | Admitting: Psychiatry

## 2014-12-12 ENCOUNTER — Telehealth (HOSPITAL_COMMUNITY): Payer: Self-pay | Admitting: *Deleted

## 2014-12-12 NOTE — Telephone Encounter (Signed)
Opened in Error.

## 2014-12-19 ENCOUNTER — Ambulatory Visit (HOSPITAL_COMMUNITY): Payer: Self-pay | Admitting: Psychology

## 2014-12-24 ENCOUNTER — Ambulatory Visit (HOSPITAL_COMMUNITY): Payer: Self-pay | Admitting: Psychology

## 2015-01-03 ENCOUNTER — Ambulatory Visit (HOSPITAL_COMMUNITY): Payer: Self-pay | Admitting: Psychology

## 2015-01-08 ENCOUNTER — Telehealth (HOSPITAL_COMMUNITY): Payer: Self-pay | Admitting: *Deleted

## 2015-01-08 NOTE — Telephone Encounter (Signed)
Pt called stating she would like refills for her Temazepam 15 mg QD. Pt medication was last filled 10-14-14 with 2 refills. Pt had 2 mo f/u appt scheduled for 11-08-14 and no showed and was rescheduled for 01-14-15 and cancelled. Pt is scheduled for f/u on 02-08-15. Pt number is [phone].

## 2015-01-08 NOTE — Telephone Encounter (Signed)
phone call from patient, she is out of Temazepam.

## 2015-01-09 ENCOUNTER — Telehealth (HOSPITAL_COMMUNITY): Payer: Self-pay | Admitting: *Deleted

## 2015-01-09 MED ORDER — TEMAZEPAM 15 MG PO CAPS: 15.0000 mg | ORAL_CAPSULE | Freq: Every evening | ORAL | Status: DC | PRN

## 2015-01-09 NOTE — Telephone Encounter (Signed)
You may call in one month supply 

## 2015-01-09 NOTE — Telephone Encounter (Signed)
Per Dr. Tenny Crawoss to call pt medication into pharmacy. Called pt pharmacy and spoke with Sam and per Sam, they will go ahead and get pt medication ready for her.

## 2015-01-09 NOTE — Telephone Encounter (Signed)
Phone in pt medication to her pharmacy and spoke with Sam and she will get pt medication ready for her.

## 2015-01-14 ENCOUNTER — Ambulatory Visit (HOSPITAL_COMMUNITY): Payer: Self-pay | Admitting: Psychiatry

## 2015-01-28 ENCOUNTER — Encounter (HOSPITAL_COMMUNITY): Payer: Self-pay | Admitting: Psychiatry

## 2015-01-28 ENCOUNTER — Ambulatory Visit (INDEPENDENT_AMBULATORY_CARE_PROVIDER_SITE_OTHER): Payer: Medicare Other | Admitting: Psychiatry

## 2015-01-28 VITALS — BP 122/81 | HR 93 | Ht 67.0 in | Wt 161.0 lb

## 2015-01-28 DIAGNOSIS — F4310 Post-traumatic stress disorder, unspecified: Secondary | ICD-10-CM | POA: Diagnosis not present

## 2015-01-28 MED ORDER — CLONAZEPAM 1 MG PO TABS: 1.0000 mg | ORAL_TABLET | Freq: Three times a day (TID) | ORAL | Status: DC

## 2015-01-28 MED ORDER — TEMAZEPAM 15 MG PO CAPS: 15.0000 mg | ORAL_CAPSULE | Freq: Every evening | ORAL | Status: DC | PRN
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
## 2015-02-05 ENCOUNTER — Ambulatory Visit (INDEPENDENT_AMBULATORY_CARE_PROVIDER_SITE_OTHER): Payer: Medicare Other | Admitting: Psychology

## 2015-02-05 DIAGNOSIS — F4310 Post-traumatic stress disorder, unspecified: Secondary | ICD-10-CM | POA: Diagnosis not present

## 2015-02-05 DIAGNOSIS — G43011 Migraine without aura, intractable, with status migrainosus: Secondary | ICD-10-CM
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
## 2015-02-10 ENCOUNTER — Telehealth (HOSPITAL_COMMUNITY): Payer: Self-pay | Admitting: *Deleted

## 2015-02-10 NOTE — Telephone Encounter (Signed)
Called pt pharmacy and spoke with Almira CoasterGina and she was informed of what Dr. Tenny Crawoss stated. Almira CoasterGina showed understanding

## 2015-02-10 NOTE — Telephone Encounter (Signed)
ok 

## 2015-02-10 NOTE — Telephone Encounter (Signed)
Pt pharmacy called stating pt informed them that [place] and would like her Klonopin to be filled early. Per Almira CoasterGina from pt pharmacy, pt last filled this medication on 01-23-15. Per pt pharmacy, they need permission from Dr. Tenny Crawoss to go ahead and fill pt medication early. Pharmacy number is [phone].

## 2015-03-18 ENCOUNTER — Telehealth (HOSPITAL_COMMUNITY): Payer: Self-pay | Admitting: *Deleted

## 2015-03-18 NOTE — Telephone Encounter (Signed)
phone call from patient.   she had two pregnancy test confirm pregnant from home test.    patient is concerned about the medications.  She is taking Tomazepam, Klonopin, and Belsomra is just sitting she has four pills left.

## 2015-03-18 NOTE — Telephone Encounter (Signed)
Telephone message left for patient this nurse would follow up with one of our providers helping to cover while Dr. Tenny Crawoss is out this week but suggested since Restoril and Clonazepam are as needed if she can do without these until hears back that would be suggested.  Informed we did not see that she is currently being prescribed Belsomra but to call back on 03/19/15 to our office to discuss with a covering provider and provided [phone].

## 2015-03-19 NOTE — Telephone Encounter (Signed)
Met with Dr. Donnelly Angelica helping to cover for Dr. Harrington Challenger out this week on vacation,  to discuss calls from patient and report she is pregnant but not sure how far along.  Discussed patient's status with how often taking Clonazepam, if 3 times a day then would need to taper and can stop Restoril.  Patient stated she only takes Clonazepam 3 times a day sometimes as is on Methadone too.  Informed if taking regularly Dr. Lovena Le suggested a taper pattern over the next 2 weeks for her to get off Clonazepam as does not recommend taking until follows up with OBGY&N set for 04/10/15.  Patient agreed to stop Restoril and discussed Belsomra she use to take as informed per Dr. Lovena Le she would need approval to restart and take this again but should question OBG[facility] for their recommendations.  Patient also agreed to follow up with [facility] immediately to inform them and to question if should continue during pregnancy as states no idea how far along.  Encouraged patient if she has any problems before seeing [facility]BGY[facility] to follow up with Dr. Harrington Challenger upon her return in the coming week.

## 2015-03-25 NOTE — Telephone Encounter (Signed)
noted 

## 2015-04-02 ENCOUNTER — Ambulatory Visit (HOSPITAL_COMMUNITY): Payer: Self-pay | Admitting: Psychology

## 2015-04-16 ENCOUNTER — Telehealth (HOSPITAL_COMMUNITY): Payer: Self-pay | Admitting: *Deleted

## 2015-04-16 NOTE — Telephone Encounter (Signed)
noted 

## 2015-04-16 NOTE — Telephone Encounter (Signed)
patient canceled appointments with Dr. Tenny Craw and Dr. Kieth Brightly due to her pregnancy.    York Spaniel they only have one car and husband has to work.

## 2015-04-17 NOTE — Telephone Encounter (Signed)
noted 

## 2015-04-29 ENCOUNTER — Ambulatory Visit (HOSPITAL_COMMUNITY): Payer: Self-pay | Admitting: Psychiatry

## 2015-04-30 ENCOUNTER — Ambulatory Visit (HOSPITAL_COMMUNITY): Payer: Self-pay | Admitting: Psychiatry

## 2015-05-02 ENCOUNTER — Other Ambulatory Visit (HOSPITAL_COMMUNITY): Payer: Self-pay | Admitting: Unknown Physician Specialty

## 2015-05-02 DIAGNOSIS — O09891 Supervision of other high risk pregnancies, first trimester: Secondary | ICD-10-CM

## 2015-05-02 DIAGNOSIS — F4310 Post-traumatic stress disorder, unspecified: Secondary | ICD-10-CM

## 2015-05-02 DIAGNOSIS — O09521 Supervision of elderly multigravida, first trimester: Secondary | ICD-10-CM

## 2015-05-13 ENCOUNTER — Encounter (HOSPITAL_COMMUNITY): Payer: Self-pay

## 2015-05-13 ENCOUNTER — Other Ambulatory Visit (HOSPITAL_COMMUNITY): Payer: Self-pay | Admitting: Unknown Physician Specialty

## 2015-05-13 ENCOUNTER — Ambulatory Visit (HOSPITAL_COMMUNITY): Payer: Self-pay | Admitting: Psychology

## 2015-05-13 ENCOUNTER — Other Ambulatory Visit (HOSPITAL_COMMUNITY): Payer: Self-pay

## 2015-05-13 ENCOUNTER — Ambulatory Visit (HOSPITAL_COMMUNITY)
Admission: RE | Admit: 2015-05-13 | Discharge: 2015-05-13 | Disposition: A | Discharge status: Home / Self Care | Payer: Medicare Other | Source: Ambulatory Visit | Attending: Unknown Physician Specialty | Admitting: Unknown Physician Specialty

## 2015-05-13 ENCOUNTER — Ambulatory Visit (HOSPITAL_COMMUNITY): Admission: RE | Admit: 2015-05-13 | Payer: Medicare Other | Source: Ambulatory Visit

## 2015-05-13 DIAGNOSIS — Z3A19 19 weeks gestation of pregnancy: Secondary | ICD-10-CM | POA: Diagnosis not present

## 2015-05-13 DIAGNOSIS — O09521 Supervision of elderly multigravida, first trimester: Secondary | ICD-10-CM

## 2015-05-13 DIAGNOSIS — O99282 Endocrine, nutritional and metabolic diseases complicating pregnancy, second trimester: Secondary | ICD-10-CM

## 2015-05-13 DIAGNOSIS — F4310 Post-traumatic stress disorder, unspecified: Secondary | ICD-10-CM

## 2015-05-13 DIAGNOSIS — F1920 Other psychoactive substance dependence, uncomplicated: Secondary | ICD-10-CM

## 2015-05-13 DIAGNOSIS — O358XX0 Maternal care for other (suspected) fetal abnormality and damage, not applicable or unspecified: Secondary | ICD-10-CM | POA: Diagnosis not present

## 2015-05-13 DIAGNOSIS — E079 Disorder of thyroid, unspecified: Secondary | ICD-10-CM

## 2015-05-13 DIAGNOSIS — Z3689 Encounter for other specified antenatal screening: Secondary | ICD-10-CM

## 2015-05-13 DIAGNOSIS — O99322 Drug use complicating pregnancy, second trimester: Secondary | ICD-10-CM

## 2015-05-13 DIAGNOSIS — O99332 Smoking (tobacco) complicating pregnancy, second trimester: Secondary | ICD-10-CM | POA: Diagnosis not present

## 2015-05-13 DIAGNOSIS — Z36 Encounter for antenatal screening of mother: Secondary | ICD-10-CM | POA: Diagnosis not present

## 2015-05-13 DIAGNOSIS — O09891 Supervision of other high risk pregnancies, first trimester: Secondary | ICD-10-CM

## 2015-05-13 DIAGNOSIS — O09522 Supervision of elderly multigravida, second trimester: Secondary | ICD-10-CM | POA: Insufficient documentation

## 2015-05-13 DIAGNOSIS — E039 Hypothyroidism, unspecified: Secondary | ICD-10-CM | POA: Insufficient documentation

## 2015-05-13 NOTE — Progress Notes (Signed)
MATERNAL FETAL MEDICINE CONSULT  Patient Name: Lindsey Griffin Medical Record Number:  [id number] Date of Birth: October 30, 1975 Requesting Physician Name:  Ernestina Penna, MD Date of Service: 05/13/2015  Chief Complaint Fetal drug exposure and advanced maternal age  History of Present Illness Lindsey Griffin was seen today secondary to fetal drug exposure and advanced maternal age at the request of Ernestina Penna, MD.  The patient is a 40 y.o. G2P1001,at 19 weeks 2 days by first trimester ultrasound who was taking methadone 5 mg po prn restless legs, tramadol po prn migraine pain, and clonazepam po prn anxiety until approximately 8 weeks of pregnancy.  She continues to take synthroid 100 mcg po daily, Maxalt 10 mg po prn migraine, gabapentin 600 mg po qhs for restless legs.  She reports that a second trimester screen revealed a 1 in 12 risk of Trisomy 35.  She had cell-free DNA testing performed which was low risk for aneuploidy.  She has no acute complaints or concerns at this time.  Review of Systems Pertinent items are noted in HPI.  Patient History OB History  Gravida Para Term Preterm AB SAB TAB Ectopic Multiple Living  # Outcome Date GA Lbr Len/2nd Weight Sex Delivery Anes PTL Lv  2 Current           1 Term               Past Medical History  Diagnosis Date  . Migraine headache   . Mild obesity   . Hypertension   . GERD (gastroesophageal reflux disease)   . Hypothyroidism   . RLS (restless legs syndrome)   . Anxiety and depression     Past Surgical History  Procedure Laterality Date  . Gallbladder surgery  2004  . Cholecystectomy      Social History   Social History  . Marital Status: Married    Spouse Name: Lindsey Griffin  . Number of Children: 1  . Years of Education: 14   Occupational History  . Unemployed    Social History Main Topics  . Smoking status: Current Every Day Smoker -- 0.50 packs/day    Types: Cigarettes  . Smokeless tobacco: Never Used  .  Alcohol Use: Yes     Comment: Consumes 2 or 3 alcholic beverages per month   . Drug Use: No  . Sexual Activity: Not Currently   Other Topics Concern  . Not on file   Social History Narrative   Patient is married Dance movement psychotherapist)    Patient has a Naval architect.   Patient is disabled.   Patient has one child.   Patient is left-handed.   Patient does not drink any caffeine.             Family History  Problem Relation Age of Onset  . Headache Mother   . Hyperthyroidism Mother   . COPD Mother   . Hypertension Mother   . Uterine cancer Mother   . Alcohol abuse Mother   . Bipolar disorder Mother   . Hypertension Father   . Hypercholesterolemia Father   . Heart attack Father   . Asthma Father   . Alcohol abuse Father   . Migraines Sister   . Schizophrenia Sister   . Stroke Brother   . Alcohol abuse Brother   . Hypothyroidism Sister   . Hypertension Sister   . Hypertension Sister   . Fibroids Sister   . Bipolar disorder  Sister   . Fibroids Sister    In addition, the patient has no family history of mental retardation, birth defects, or genetic diseases.  Physical Examination Vitals - BP 113/74, Pulse 86, Weight 162.2 lbs. General appearance - alert, well appearing, and in no distress  Assessment and Recommendations 1.  Fetal drug exposure.  The fetal exposure to methadone, tramadol, and clonazepam does not appear to have resulted in any congenital anomalies as [name]'s fetal anatomic survey was normal.  However, a small anomaly cannot be ruled out prior to delivery.  There is also unlikely to be any neurological deficits based on the very early and low level of exposure to these medications.  Her current medications, levothyroxine, Maxalt, and gabapentin are generally regarded as safe in pregnancy and also pose a low risk of fetal harm, which is clearly outweighed by there benefits.  The benefits are particularly notable with Maxalt as [name] has daily migraines and is  often incapacitated by them if she does not have access to Maxalt. 2.  Advanced Maternal Age.  [name] report of a positive second trimester screen for Trisomy 54 followed by a low risk cell-free DNA testing ([facility]) were confirmed by review of her medical records from Dr. Ralph Dowdy.  The low risk cell-free DNA testing and the normal ultrasound today virtually rules out Trisomy 18; however, an amniocentesis and karyotype would be required to 100% confirm that.  Given the low risk of Trisomy 62, I do not think an amniocentesis is warranted at this time.  I spent 30 minutes with [name] today of which 50% was face-to-face counseling.  Thank you for referring [name] to the [facility].  Please do not hesitate to contact us with questions.   Rema Fendt, MD

## 2015-06-05 ENCOUNTER — Other Ambulatory Visit (HOSPITAL_COMMUNITY): Payer: Self-pay

## 2015-12-11 ENCOUNTER — Ambulatory Visit (HOSPITAL_COMMUNITY): Payer: Self-pay | Admitting: Psychiatry

## 2016-03-17 ENCOUNTER — Encounter (HOSPITAL_COMMUNITY): Payer: Self-pay

## 2016-04-19 ENCOUNTER — Encounter: Payer: Self-pay | Admitting: Women's Health

## 2016-05-26 ENCOUNTER — Telehealth (HOSPITAL_COMMUNITY): Payer: Self-pay | Admitting: *Deleted

## 2016-05-26 NOTE — Telephone Encounter (Signed)
Returned phone call to patient regarding bills.

## 2016-05-27 ENCOUNTER — Telehealth (HOSPITAL_COMMUNITY): Payer: Self-pay | Admitting: *Deleted

## 2016-05-27 NOTE — Telephone Encounter (Signed)
returned phone call regarding an appointment. 

## 2017-04-14 ENCOUNTER — Ambulatory Visit (INDEPENDENT_AMBULATORY_CARE_PROVIDER_SITE_OTHER): Payer: Medicare Other | Admitting: Orthopaedic Surgery

## 2017-04-19 ENCOUNTER — Ambulatory Visit (INDEPENDENT_AMBULATORY_CARE_PROVIDER_SITE_OTHER): Payer: Medicare Other | Admitting: Orthopaedic Surgery

## 2017-04-19 ENCOUNTER — Ambulatory Visit (INDEPENDENT_AMBULATORY_CARE_PROVIDER_SITE_OTHER): Payer: Medicare Other

## 2017-04-19 DIAGNOSIS — M25551 Pain in right hip: Secondary | ICD-10-CM

## 2017-04-19 DIAGNOSIS — M25552 Pain in left hip: Secondary | ICD-10-CM

## 2017-04-19 DIAGNOSIS — S99922A Unspecified injury of left foot, initial encounter: Secondary | ICD-10-CM | POA: Diagnosis not present

## 2017-04-19 NOTE — Progress Notes (Signed)
Office Visit Note   Patient: Lindsey Griffin           Date of Birth: 01/11/1976           MRN: [id number] Visit Date: 04/19/2017              Requested by: Kirstie PeriShah, Ashish, MD [address] [place], [place] [id number] PCP: Kirstie PeriShah, Ashish, MD   Assessment & Plan: Visit Diagnoses:  1. Pain of both hip joints   2. Toe injury, left, initial encounter     Plan: Patient has bilateral hip osteonecrosis with much worse on the right with mild femoral head collapse on x-ray and significant sclerosis of the femoral head.  She also has a nondisplaced fracture of the distal phalanx of the left third toe.  This can be treated nonoperatively with buddy taping and postop shoe.  In terms of her hips which had a long discussion today regarding treatment options that include core decompression, free fibula, total hip replacement.  We discussed the pros and cons of each treatment option.  I gave her my card and she will call us with her decision.  Follow-Up Instructions: Return if symptoms worsen or fail to improve.   Orders:  Orders Placed This Encounter  Procedures  . XR Toe 3rd Left   No orders of the defined types were placed in this encounter.     Procedures: No procedures performed   Clinical Data: No additional findings.   Subjective: Chief Complaint  Patient presents with  . Right Hip - Pain  . Left Hip - Pain    Patient is a very pleasant 42 year old female comes in for bilateral hip osteonecrosis and left third toe pain.  Patient comes in with one year history of worsening hip pain that is worse on the right.  She is now limping.  She endorses pain in her hip that occasionally will radiate down the thigh.  She is on disability.  She denies a history of alcohol use, corticosteroid use, sickle cell disease.  She is now having significant difficulty with ADLs and has constant night pain.  She is also endorsing left third toe pain after she stubbed it.  She does have swelling and bruising.  She  denies any back pain or radicular symptoms.    Review of Systems  Constitutional: Negative.   HENT: Negative.   Eyes: Negative.   Respiratory: Negative.   Cardiovascular: Negative.   Endocrine: Negative.   Musculoskeletal: Negative.   Neurological: Negative.   Hematological: Negative.   Psychiatric/Behavioral: Negative.   All other systems reviewed and are negative.    Objective: Vital Signs: There were no vitals taken for this visit.  Physical Exam  Constitutional: She is oriented to person, place, and time. She appears well-developed and well-nourished.  HENT:  Head: Normocephalic and atraumatic.  Eyes: EOM are normal.  Neck: Neck supple.  Pulmonary/Chest: Effort normal.  Abdominal: Soft.  Neurological: She is alert and oriented to person, place, and time.  Skin: Skin is warm. Capillary refill takes less than 2 seconds.  Psychiatric: She has a normal mood and affect. Her behavior is normal. Judgment and thought content normal.  Nursing note and vitals reviewed.   Ortho Exam Bilateral hip exams show painful rotation worse on the right.  Positive Stinchfield sign.  No back pain or focal motor or sensory deficits.  Trochanters are nontender. Third toe exam shows swelling and bruising with tenderness to palpation.  Clinically aligned. Specialty Comments:  No specialty comments  available.  Imaging: Xr Toe 3rd Left  Result Date: 04/19/2017 Nondisplaced distal phalanx fracture    PMFS History: Patient Active Problem List   Diagnosis Date Noted  . PTSD (post-traumatic stress disorder) 06/14/2014  . Restless legs syndrome (RLS) 12/03/2011  . Migraine without aura, with intractable migraine, so stated, without mention of status migrainosus 12/03/2011   Past Medical History:  Diagnosis Date  . Anxiety and depression   . GERD (gastroesophageal reflux disease)   . Hypertension   . Hypothyroidism   . Migraine headache   . Mild obesity   . RLS (restless legs  syndrome)     Family History  Problem Relation Age of Onset  . Headache Mother   . Hyperthyroidism Mother   . COPD Mother   . Hypertension Mother   . Uterine cancer Mother   . Alcohol abuse Mother   . Bipolar disorder Mother   . Hypertension Father   . Hypercholesterolemia Father   . Heart attack Father   . Asthma Father   . Alcohol abuse Father   . Migraines Sister   . Schizophrenia Sister   . Stroke Brother   . Alcohol abuse Brother   . Hypothyroidism Sister   . Hypertension Sister   . Hypertension Sister   . Fibroids Sister   . Bipolar disorder Sister   . Fibroids Sister     Past Surgical History:  Procedure Laterality Date  . CHOLECYSTECTOMY    . GALLBLADDER SURGERY  2004   Social History   Occupational History  . Occupation: Unemployed  Tobacco Use  . Smoking status: Current Every Day Smoker    Packs/day: 0.50    Types: Cigarettes  . Smokeless tobacco: Never Used  Substance and Sexual Activity  . Alcohol use: Yes    Comment: Consumes 2 or 3 alcholic beverages per month   . Drug use: No  . Sexual activity: Not Currently

## 2017-04-25 ENCOUNTER — Encounter (INDEPENDENT_AMBULATORY_CARE_PROVIDER_SITE_OTHER): Payer: Self-pay | Admitting: Orthopaedic Surgery

## 2017-04-25 ENCOUNTER — Telehealth (INDEPENDENT_AMBULATORY_CARE_PROVIDER_SITE_OTHER): Payer: Self-pay | Admitting: Orthopaedic Surgery

## 2017-04-25 NOTE — Telephone Encounter (Signed)
error 

## 2017-05-24 ENCOUNTER — Telehealth (INDEPENDENT_AMBULATORY_CARE_PROVIDER_SITE_OTHER): Payer: Self-pay

## 2017-05-24 NOTE — Telephone Encounter (Signed)
Patient called stating that she would like a Rx for pain.  Cb# is [phone].  Please advise.  Thank you.

## 2017-06-27 ENCOUNTER — Encounter (INDEPENDENT_AMBULATORY_CARE_PROVIDER_SITE_OTHER): Payer: Self-pay | Admitting: Orthopaedic Surgery

## 2017-06-27 ENCOUNTER — Ambulatory Visit (INDEPENDENT_AMBULATORY_CARE_PROVIDER_SITE_OTHER): Payer: Self-pay

## 2017-06-27 ENCOUNTER — Ambulatory Visit (INDEPENDENT_AMBULATORY_CARE_PROVIDER_SITE_OTHER): Payer: Medicare Other | Admitting: Orthopaedic Surgery

## 2017-06-27 DIAGNOSIS — M87051 Idiopathic aseptic necrosis of right femur: Secondary | ICD-10-CM

## 2017-06-27 NOTE — Progress Notes (Signed)
Office Visit Note   Patient: Lindsey Griffin           Date of Birth: [birth year]           MRN: [id number] Visit Date: 06/27/2017              Requested by: Kirstie Peri, MD [address] [place], [place] [address] PCP: Kirstie Peri, MD   Assessment & Plan: Visit Diagnoses:  1. Avascular necrosis of bone of right hip (HCC)     Plan: Impression is progression of right hip osteonecrosis with interval collapse compared to x-rays about 9-10 months ago.  At this point patient wishes to pursue a right total hip replacement after thorough discussion of risks and benefits.  She will make arrangements so that she can have the surgery sometime in May.  Questions encouraged and answered.  Follow-Up Instructions: Return if symptoms worsen or fail to improve.   Orders:  Orders Placed This Encounter  Procedures  . XR HIP UNILAT W OR W/O PELVIS 2-3 VIEWS RIGHT   No orders of the defined types were placed in this encounter.     Procedures: No procedures performed   Clinical Data: No additional findings.   Subjective: Chief Complaint  Patient presents with  . Right Hip - Pain, Follow-up    Patient is a 42 year old female who follows up today for continued right hip pain.  I previously saw her about 3 months ago.  Her pain has gotten significantly worse.  She is now walking with a cane.  She has severe difficulty with ADLs.  She has chronic night pain.  Denies any back pain or numbness and tingling.   Review of Systems  Constitutional: Negative.   HENT: Negative.   Eyes: Negative.   Respiratory: Negative.   Cardiovascular: Negative.   Endocrine: Negative.   Musculoskeletal: Negative.   Neurological: Negative.   Hematological: Negative.   Psychiatric/Behavioral: Negative.   All other systems reviewed and are negative.    Objective: Vital Signs: There were no vitals taken for this visit.  Physical Exam  Constitutional: She is oriented to person, place, and time. She appears  well-developed and well-nourished.  Pulmonary/Chest: Effort normal.  Neurological: She is alert and oriented to person, place, and time.  Skin: Skin is warm. Capillary refill takes less than 2 seconds.  Psychiatric: She has a normal mood and affect. Her behavior is normal. Judgment and thought content normal.  Nursing note and vitals reviewed.   Ortho Exam Right hip exam shows very limited range of motion secondary to pain and guarding. Specialty Comments:  No specialty comments available.  Imaging: Xr Hip Unilat W Or W/o Pelvis 2-3 Views Right  Result Date: 06/27/2017 Osteonecrosis of right femoral head with collapse and multiple sclerotic lesions    PMFS History: Patient Active Problem List   Diagnosis Date Noted  . Avascular necrosis of bone of right hip (HCC) 06/27/2017  . PTSD (post-traumatic stress disorder) 06/14/2014  . Restless legs syndrome (RLS) 12/03/2011  . Migraine without aura, with intractable migraine, so stated, without mention of status migrainosus 12/03/2011   Past Medical History:  Diagnosis Date  . Anxiety and depression   . GERD (gastroesophageal reflux disease)   . Hypertension   . Hypothyroidism   . Migraine headache   . Mild obesity   . RLS (restless legs syndrome)     Family History  Problem Relation Age of Onset  . Headache Mother   . Hyperthyroidism Mother   . COPD  Mother   . Hypertension Mother   . Uterine cancer Mother   . Alcohol abuse Mother   . Bipolar disorder Mother   . Hypertension Father   . Hypercholesterolemia Father   . Heart attack Father   . Asthma Father   . Alcohol abuse Father   . Migraines Sister   . Schizophrenia Sister   . Stroke Brother   . Alcohol abuse Brother   . Hypothyroidism Sister   . Hypertension Sister   . Hypertension Sister   . Fibroids Sister   . Bipolar disorder Sister   . Fibroids Sister     Past Surgical History:  Procedure Laterality Date  . CHOLECYSTECTOMY    . GALLBLADDER SURGERY  2004    Social History   Occupational History  . Occupation: Unemployed  Tobacco Use  . Smoking status: Current Every Day Smoker    Packs/day: 0.50    Types: Cigarettes  . Smokeless tobacco: Never Used  Substance and Sexual Activity  . Alcohol use: Yes    Comment: Consumes 2 or 3 alcholic beverages per month   . Drug use: No  . Sexual activity: Not Currently

## 2017-07-03 ENCOUNTER — Encounter (INDEPENDENT_AMBULATORY_CARE_PROVIDER_SITE_OTHER): Payer: Self-pay | Admitting: Orthopaedic Surgery

## 2017-07-25 ENCOUNTER — Ambulatory Visit (HOSPITAL_COMMUNITY): Payer: Self-pay | Admitting: Psychiatry

## 2017-07-26 ENCOUNTER — Ambulatory Visit (HOSPITAL_COMMUNITY): Payer: Self-pay | Admitting: Psychiatry

## 2017-07-28 ENCOUNTER — Ambulatory Visit (INDEPENDENT_AMBULATORY_CARE_PROVIDER_SITE_OTHER): Payer: Medicare Other | Admitting: Orthopaedic Surgery

## 2017-08-05 ENCOUNTER — Other Ambulatory Visit: Payer: Self-pay

## 2017-08-05 ENCOUNTER — Encounter (HOSPITAL_COMMUNITY)
Admission: RE | Admit: 2017-08-05 | Discharge: 2017-08-05 | Disposition: A | Discharge status: Home / Self Care | Payer: Medicare Other | Source: Ambulatory Visit | Attending: Orthopaedic Surgery | Admitting: Orthopaedic Surgery

## 2017-08-05 ENCOUNTER — Encounter (HOSPITAL_COMMUNITY): Payer: Self-pay

## 2017-08-05 ENCOUNTER — Ambulatory Visit (HOSPITAL_COMMUNITY)
Admission: RE | Admit: 2017-08-05 | Discharge: 2017-08-05 | Disposition: A | Discharge status: Home / Self Care | Payer: Medicare Other | Source: Ambulatory Visit | Attending: Physician Assistant | Admitting: Physician Assistant

## 2017-08-05 DIAGNOSIS — Z01810 Encounter for preprocedural cardiovascular examination: Secondary | ICD-10-CM | POA: Diagnosis present

## 2017-08-05 DIAGNOSIS — M8788 Other osteonecrosis, other site: Secondary | ICD-10-CM | POA: Insufficient documentation

## 2017-08-05 DIAGNOSIS — Z01818 Encounter for other preprocedural examination: Secondary | ICD-10-CM | POA: Diagnosis present

## 2017-08-05 DIAGNOSIS — M87051 Idiopathic aseptic necrosis of right femur: Secondary | ICD-10-CM

## 2017-08-05 DIAGNOSIS — Z01812 Encounter for preprocedural laboratory examination: Secondary | ICD-10-CM | POA: Diagnosis not present

## 2017-08-05 HISTORY — DX: Heart failure, unspecified: I50.9

## 2017-08-05 HISTORY — DX: Unspecified pre-eclampsia, unspecified trimester: O14.90

## 2017-08-05 HISTORY — DX: Unspecified osteoarthritis, unspecified site: M19.90

## 2017-08-05 LAB — COMPREHENSIVE METABOLIC PANEL WITH GFR
ALT: 11 U/L — ABNORMAL LOW (ref 14–54)
AST: 13 U/L — ABNORMAL LOW (ref 15–41)
Albumin: 4.2 g/dL (ref 3.5–5.0)
Alkaline Phosphatase: 61 U/L (ref 38–126)
Anion gap: 10 (ref 5–15)
BUN: 7 mg/dL (ref 6–20)
CO2: 31 mmol/L (ref 22–32)
Calcium: 9.7 mg/dL (ref 8.9–10.3)
Chloride: 99 mmol/L — ABNORMAL LOW (ref 101–111)
Creatinine, Ser: 0.75 mg/dL (ref 0.44–1.00)
GFR calc Af Amer: >60 mL/min
GFR calc non Af Amer: >60 mL/min
Glucose, Bld: 97 mg/dL (ref 65–99)
Potassium: 3.9 mmol/L (ref 3.5–5.1)
Sodium: 140 mmol/L (ref 135–145)
Total Bilirubin: 0.7 mg/dL (ref 0.3–1.2)
Total Protein: 7.5 g/dL (ref 6.5–8.1)

## 2017-08-05 LAB — CBC WITH DIFFERENTIAL/PLATELET
BASOS PCT: 1 %
Basophils Absolute: 0.1 10*3/uL (ref 0.0–0.1)
Eosinophils Absolute: 0.6 10*3/uL (ref 0.0–0.7)
Eosinophils Relative: 6 %
HEMATOCRIT: 49.3 % — AB (ref 36.0–46.0)
Hemoglobin: 16.9 g/dL — ABNORMAL HIGH (ref 12.0–15.0)
LYMPHS PCT: 27 %
Lymphs Abs: 2.6 10*3/uL (ref 0.7–4.0)
MCH: 32.4 pg (ref 26.0–34.0)
MCHC: 34.3 g/dL (ref 30.0–36.0)
MCV: 94.4 fL (ref 78.0–100.0)
MONO ABS: 0.6 10*3/uL (ref 0.1–1.0)
MONOS PCT: 6 %
NEUTROS ABS: 5.8 10*3/uL (ref 1.7–7.7)
Neutrophils Relative %: 60 %
Platelets: 255 10*3/uL (ref 150–400)
RBC: 5.22 MIL/uL — ABNORMAL HIGH (ref 3.87–5.11)
RDW: 13.2 % (ref 11.5–15.5)
WBC: 9.7 10*3/uL (ref 4.0–10.5)

## 2017-08-05 LAB — TYPE AND SCREEN
ABO/RH(D): A NEG
Antibody Screen: NEGATIVE

## 2017-08-05 LAB — PROTIME-INR
INR: 0.97
Prothrombin Time: 12.8 s (ref 11.4–15.2)

## 2017-08-05 LAB — APTT: aPTT: 27 seconds (ref 24–36)

## 2017-08-05 LAB — ABO/RH: ABO/RH(D): A NEG

## 2017-08-05 LAB — SURGICAL PCR SCREEN
MRSA, PCR: NEGATIVE
STAPHYLOCOCCUS AUREUS: NEGATIVE

## 2017-08-05 NOTE — Pre-Procedure Instructions (Signed)
Lindsey Griffin  08/05/2017    Your procedure is scheduled on Thursday, Aug 18, 2017 at 12:15 PM.   Report to [facility] Entrance "A" Admitting Office at 10:15 AM.   Call this number if you have problems the morning of surgery: [phone]   Questions prior to day of surgery, please call [phone] between 8 & 4 PM.   Remember:  Do not eat food or drink liquids after midnight Wednesday, 08/17/17.  Take these medicines the morning of surgery with A SIP OF WATER: Acetaminophen (Tylenol), Amlodipine (Norvasc), Levothyroxine (Synthroid), Omeprazole (Prilosec), Ranitidine (Zantac), Tramadol - if needed, Clonazepam (Klonopin) - if needed  Stop Co Q10 7 days prior to surgery. Do not use NSAIDS (Ibuprofen, Aleve, etc) or Aspirin products 7 days prior to surgery.  Do NOT smoke 24 hours prior to surgery.   Do not wear jewelry, make-up or nail polish.  Do not wear lotions, powders, perfumes or deodorant.  Do not shave 48 hours prior to surgery.    Do not bring valuables to the hospital.  [facility] is not responsible for any belongings or valuables.  Contacts, dentures or bridgework may not be worn into surgery.  Leave your suitcase in the car.  After surgery it may be brought to your room.  For patients admitted to the hospital, discharge time will be determined by your treatment team.  [facility] - Preparing for Surgery  Before surgery, you can play an important role.  Because skin is not sterile, your skin needs to be as free of germs as possible.  You can reduce the number of germs on you skin by washing with CHG (chlorahexidine gluconate) soap before surgery.  CHG is an antiseptic cleaner which kills germs and bonds with the skin to continue killing germs even after washing.  Please DO NOT use if you have an allergy to CHG or antibacterial soaps.  If your skin becomes reddened/irritated stop using the CHG and inform your nurse when you arrive at Short Stay.  Do not shave  (including legs and underarms) for at least 48 hours prior to the first CHG shower.  You may shave your face.  Please follow these instructions carefully:   1.  Shower with CHG Soap the night before surgery and the                    morning of Surgery.  2.  If you choose to wash your hair, wash your hair first as usual with your       normal shampoo.  3.  After you shampoo, rinse your hair and body thoroughly to remove the shampoo.  4.  Use CHG as you would any other liquid soap.  You can apply chg directly       to the skin and wash gently with scrungie or a clean washcloth.  5.  Apply the CHG Soap to your body ONLY FROM THE NECK DOWN.        Do not use on open wounds or open sores.  Avoid contact with your eyes, ears, mouth and genitals (private parts).  Wash genitals (private parts) with your normal soap.  6.  Wash thoroughly, paying special attention to the area where your surgery        will be performed.  7.  Thoroughly rinse your body with warm water from the neck down.  8.  DO NOT shower/wash with your normal soap after using and rinsing off  the CHG Soap.  9.  Pat yourself dry with a clean towel.            10.  Wear clean pajamas.            11.  Place clean sheets on your bed the night of your first shower and do not        sleep with pets.  Day of Surgery  Shower as above. Do not apply any lotions/deodorants the morning of surgery.  Please wear clean clothes to the hospital.   Please read over the fact sheets that you were given.

## 2017-08-05 NOTE — Progress Notes (Signed)
PCP: Dr. Sherryll Burger Cardiologist: Denies  EKG: Today CXR: Today ECHO: Denies Stress Test: Denies Cardiac Cath: Denies  Instructed no smoking 24 hours prior to surgery.  Urine Preg test DOS.  Patient denies shortness of breath, fever, cough, and chest pain at PAT appointment.  Patient verbalized understanding of instructions provided today at the PAT appointment.  Patient asked to review instructions at home and day of surgery.

## 2017-08-10 ENCOUNTER — Other Ambulatory Visit (INDEPENDENT_AMBULATORY_CARE_PROVIDER_SITE_OTHER): Payer: Self-pay

## 2017-08-15 ENCOUNTER — Other Ambulatory Visit (INDEPENDENT_AMBULATORY_CARE_PROVIDER_SITE_OTHER): Payer: Self-pay

## 2017-08-15 ENCOUNTER — Telehealth (INDEPENDENT_AMBULATORY_CARE_PROVIDER_SITE_OTHER): Payer: Self-pay | Admitting: Orthopaedic Surgery

## 2017-08-15 MED ORDER — NICOTINE 7 MG/24HR TD PT24: MEDICATED_PATCH | TRANSDERMAL | 0 refills | Status: AC

## 2017-08-15 NOTE — Telephone Encounter (Signed)
She and the social worker will discuss that once she's had the surgery while she's in the hospital.

## 2017-08-15 NOTE — Telephone Encounter (Signed)
See message.

## 2017-08-15 NOTE — Telephone Encounter (Signed)
Sure nicotine patch 7 mg/day.  Apply 1 daily.  #30

## 2017-08-15 NOTE — Telephone Encounter (Signed)
Do you want to approve for handicap form?

## 2017-08-15 NOTE — Telephone Encounter (Signed)
Patient called stating that she is trying to quit smoking cold [place] before her surgery but it isn't working out too well. She was wondering if she could get a RX for some patches to help her quit. CB # [phone]

## 2017-08-15 NOTE — Telephone Encounter (Signed)
Please advise 

## 2017-08-15 NOTE — Telephone Encounter (Signed)
Called patient no answer, LMOM with details.

## 2017-08-15 NOTE — Telephone Encounter (Signed)
Yes 6 months

## 2017-08-15 NOTE — Telephone Encounter (Signed)
Patient asked which rehab center will she be going to after surgery? Patient also asked about (PT) The number to contact patient is [phone]

## 2017-08-15 NOTE — Telephone Encounter (Signed)
Patient called back to let Dr. Roda Shutters know she is going to be faxing a renewal for her handicap placard. She will need this signed because the current one expires next month. Can you call her once received, signed and ready for pick up? # [phone]

## 2017-08-15 NOTE — Telephone Encounter (Signed)
Called into pharm. LMOM to let patient know

## 2017-08-16 ENCOUNTER — Telehealth (INDEPENDENT_AMBULATORY_CARE_PROVIDER_SITE_OTHER): Payer: Self-pay | Admitting: Orthopaedic Surgery

## 2017-08-16 NOTE — Telephone Encounter (Signed)
yes

## 2017-08-16 NOTE — Telephone Encounter (Signed)
Patient called asking if she could possibly take CBD oil for pain before surgery? CB # [phone]

## 2017-08-16 NOTE — Telephone Encounter (Signed)
Please advise 

## 2017-08-17 MED ORDER — TRANEXAMIC ACID 1000 MG/10ML IV SOLN
1000.0000 mg | INTRAVENOUS | Status: AC
  Administered 2017-08-18: 1000 mg via INTRAVENOUS
  Filled 2017-08-17: qty 1100

## 2017-08-17 MED ORDER — TRANEXAMIC ACID 1000 MG/10ML IV SOLN
2000.0000 mg | INTRAVENOUS | Status: DC
  Filled 2017-08-17: qty 20

## 2017-08-17 NOTE — Telephone Encounter (Signed)
Called patient. She is aware.   

## 2017-08-17 NOTE — Telephone Encounter (Signed)
Ready for pick up, patient aware.

## 2017-08-18 ENCOUNTER — Inpatient Hospital Stay (HOSPITAL_COMMUNITY): Payer: Medicare Other | Admitting: Anesthesiology

## 2017-08-18 ENCOUNTER — Inpatient Hospital Stay (HOSPITAL_COMMUNITY): Payer: Medicare Other

## 2017-08-18 ENCOUNTER — Encounter (HOSPITAL_COMMUNITY)
Admission: RE | Disposition: A | Discharge status: Home Health | Payer: Self-pay | Source: Ambulatory Visit | Attending: Orthopaedic Surgery

## 2017-08-18 ENCOUNTER — Encounter (HOSPITAL_COMMUNITY): Payer: Self-pay

## 2017-08-18 ENCOUNTER — Inpatient Hospital Stay (HOSPITAL_COMMUNITY)
Admission: RE | Admit: 2017-08-18 | Discharge: 2017-08-19 | DRG: 470 | Disposition: A | Discharge status: Home Health | Payer: Medicare Other | Source: Ambulatory Visit | Attending: Orthopaedic Surgery | Admitting: Orthopaedic Surgery

## 2017-08-18 DIAGNOSIS — Z6824 Body mass index (BMI) 24.0-24.9, adult: Secondary | ICD-10-CM | POA: Diagnosis not present

## 2017-08-18 DIAGNOSIS — E039 Hypothyroidism, unspecified: Secondary | ICD-10-CM | POA: Diagnosis present

## 2017-08-18 DIAGNOSIS — M8788 Other osteonecrosis, other site: Secondary | ICD-10-CM | POA: Diagnosis present

## 2017-08-18 DIAGNOSIS — E669 Obesity, unspecified: Secondary | ICD-10-CM | POA: Diagnosis present

## 2017-08-18 DIAGNOSIS — Z7952 Long term (current) use of systemic steroids: Secondary | ICD-10-CM | POA: Diagnosis not present

## 2017-08-18 DIAGNOSIS — F17210 Nicotine dependence, cigarettes, uncomplicated: Secondary | ICD-10-CM | POA: Diagnosis present

## 2017-08-18 DIAGNOSIS — Z823 Family history of stroke: Secondary | ICD-10-CM | POA: Diagnosis not present

## 2017-08-18 DIAGNOSIS — F419 Anxiety disorder, unspecified: Secondary | ICD-10-CM | POA: Diagnosis present

## 2017-08-18 DIAGNOSIS — Z8049 Family history of malignant neoplasm of other genital organs: Secondary | ICD-10-CM

## 2017-08-18 DIAGNOSIS — Z419 Encounter for procedure for purposes other than remedying health state, unspecified: Secondary | ICD-10-CM

## 2017-08-18 DIAGNOSIS — K219 Gastro-esophageal reflux disease without esophagitis: Secondary | ICD-10-CM | POA: Diagnosis present

## 2017-08-18 DIAGNOSIS — Z888 Allergy status to other drugs, medicaments and biological substances status: Secondary | ICD-10-CM | POA: Diagnosis not present

## 2017-08-18 DIAGNOSIS — M87051 Idiopathic aseptic necrosis of right femur: Secondary | ICD-10-CM

## 2017-08-18 DIAGNOSIS — Z8249 Family history of ischemic heart disease and other diseases of the circulatory system: Secondary | ICD-10-CM

## 2017-08-18 DIAGNOSIS — Z79891 Long term (current) use of opiate analgesic: Secondary | ICD-10-CM

## 2017-08-18 DIAGNOSIS — Z91010 Allergy to peanuts: Secondary | ICD-10-CM

## 2017-08-18 DIAGNOSIS — F329 Major depressive disorder, single episode, unspecified: Secondary | ICD-10-CM | POA: Diagnosis present

## 2017-08-18 DIAGNOSIS — Z79890 Hormone replacement therapy (postmenopausal): Secondary | ICD-10-CM | POA: Diagnosis not present

## 2017-08-18 DIAGNOSIS — I10 Essential (primary) hypertension: Secondary | ICD-10-CM | POA: Diagnosis present

## 2017-08-18 DIAGNOSIS — Z79899 Other long term (current) drug therapy: Secondary | ICD-10-CM

## 2017-08-18 DIAGNOSIS — M1990 Unspecified osteoarthritis, unspecified site: Secondary | ICD-10-CM | POA: Diagnosis present

## 2017-08-18 DIAGNOSIS — Z811 Family history of alcohol abuse and dependence: Secondary | ICD-10-CM

## 2017-08-18 DIAGNOSIS — Z818 Family history of other mental and behavioral disorders: Secondary | ICD-10-CM | POA: Diagnosis not present

## 2017-08-18 DIAGNOSIS — Z9049 Acquired absence of other specified parts of digestive tract: Secondary | ICD-10-CM | POA: Diagnosis not present

## 2017-08-18 DIAGNOSIS — Z8349 Family history of other endocrine, nutritional and metabolic diseases: Secondary | ICD-10-CM | POA: Diagnosis not present

## 2017-08-18 DIAGNOSIS — Z825 Family history of asthma and other chronic lower respiratory diseases: Secondary | ICD-10-CM

## 2017-08-18 DIAGNOSIS — G2581 Restless legs syndrome: Secondary | ICD-10-CM | POA: Diagnosis present

## 2017-08-18 DIAGNOSIS — G43909 Migraine, unspecified, not intractable, without status migrainosus: Secondary | ICD-10-CM | POA: Diagnosis present

## 2017-08-18 DIAGNOSIS — Z96649 Presence of unspecified artificial hip joint: Secondary | ICD-10-CM

## 2017-08-18 HISTORY — DX: Idiopathic aseptic necrosis of unspecified femur: M87.059

## 2017-08-18 HISTORY — PX: TOTAL HIP ARTHROPLASTY: SHX124

## 2017-08-18 LAB — POCT PREGNANCY, URINE: Preg Test, Ur: NEGATIVE

## 2017-08-18 SURGERY — ARTHROPLASTY, HIP, TOTAL, ANTERIOR APPROACH
Anesthesia: General | Site: Hip | Laterality: Right

## 2017-08-18 MED ORDER — ASPIRIN 81 MG PO CHEW
81.0000 mg | CHEWABLE_TABLET | Freq: Two times a day (BID) | ORAL | Status: DC
  Administered 2017-08-19: 81 mg via ORAL
  Filled 2017-08-18: qty 1

## 2017-08-18 MED ORDER — CHLORHEXIDINE GLUCONATE 4 % EX LIQD: 60.0000 mL | Freq: Once | CUTANEOUS | Status: DC

## 2017-08-18 MED ORDER — MIDAZOLAM HCL 5 MG/5ML IJ SOLN
INTRAMUSCULAR | Status: DC | PRN
  Administered 2017-08-18: 2 mg via INTRAVENOUS

## 2017-08-18 MED ORDER — MAGNESIUM CITRATE PO SOLN: 1.0000 | Freq: Once | ORAL | Status: DC | PRN

## 2017-08-18 MED ORDER — TRANEXAMIC ACID 1000 MG/10ML IV SOLN
INTRAVENOUS | Status: DC | PRN
  Administered 2017-08-18: 2000 mg via TOPICAL

## 2017-08-18 MED ORDER — DEXAMETHASONE SODIUM PHOSPHATE 10 MG/ML IJ SOLN
INTRAMUSCULAR | Status: DC | PRN
  Administered 2017-08-18: 8 mg via INTRAVENOUS

## 2017-08-18 MED ORDER — CEFAZOLIN SODIUM-DEXTROSE 2-4 GM/100ML-% IV SOLN
2.0000 g | Freq: Four times a day (QID) | INTRAVENOUS | Status: AC
  Administered 2017-08-18 – 2017-08-19 (×3): 2 g via INTRAVENOUS
  Filled 2017-08-18 (×3): qty 100

## 2017-08-18 MED ORDER — HYDROMORPHONE HCL 2 MG/ML IJ SOLN
0.5000 mg | INTRAMUSCULAR | Status: DC | PRN
  Administered 2017-08-18 – 2017-08-19 (×4): 1 mg via INTRAVENOUS
  Filled 2017-08-18 (×4): qty 1

## 2017-08-18 MED ORDER — FENTANYL CITRATE (PF) 250 MCG/5ML IJ SOLN
INTRAMUSCULAR | Status: DC | PRN
  Administered 2017-08-18: 100 ug via INTRAVENOUS

## 2017-08-18 MED ORDER — VANCOMYCIN HCL 1000 MG IV SOLR
INTRAVENOUS | Status: AC
  Filled 2017-08-18: qty 1000

## 2017-08-18 MED ORDER — CO Q 10 100 MG PO CAPS: 100.0000 mg | ORAL_CAPSULE | Freq: Every day | ORAL | Status: DC

## 2017-08-18 MED ORDER — OXYCODONE HCL 5 MG PO TABS
ORAL_TABLET | ORAL | Status: AC
  Filled 2017-08-18: qty 1

## 2017-08-18 MED ORDER — ASPIRIN EC 81 MG PO TBEC: 81.0000 mg | DELAYED_RELEASE_TABLET | Freq: Two times a day (BID) | ORAL | 0 refills | Status: DC

## 2017-08-18 MED ORDER — OXYCODONE HCL 5 MG PO TABS
10.0000 mg | ORAL_TABLET | ORAL | Status: DC | PRN
  Filled 2017-08-18: qty 2

## 2017-08-18 MED ORDER — OXYCODONE HCL ER 10 MG PO T12A
10.0000 mg | EXTENDED_RELEASE_TABLET | Freq: Two times a day (BID) | ORAL | Status: DC
  Administered 2017-08-18 – 2017-08-19 (×2): 10 mg via ORAL
  Filled 2017-08-18 (×2): qty 1

## 2017-08-18 MED ORDER — METOCLOPRAMIDE HCL 5 MG/ML IJ SOLN: 5.0000 mg | Freq: Three times a day (TID) | INTRAMUSCULAR | Status: DC | PRN

## 2017-08-18 MED ORDER — OXYCODONE HCL ER 10 MG PO T12A: 10.0000 mg | EXTENDED_RELEASE_TABLET | Freq: Two times a day (BID) | ORAL | 0 refills | Status: AC

## 2017-08-18 MED ORDER — ONDANSETRON HCL 4 MG/2ML IJ SOLN: 4.0000 mg | Freq: Once | INTRAMUSCULAR | Status: DC | PRN

## 2017-08-18 MED ORDER — METHOCARBAMOL 500 MG PO TABS
500.0000 mg | ORAL_TABLET | Freq: Four times a day (QID) | ORAL | Status: DC | PRN
  Administered 2017-08-18 – 2017-08-19 (×2): 500 mg via ORAL
  Filled 2017-08-18 (×3): qty 1

## 2017-08-18 MED ORDER — POLYETHYLENE GLYCOL 3350 17 G PO PACK: 17.0000 g | PACK | Freq: Every day | ORAL | Status: DC | PRN

## 2017-08-18 MED ORDER — PROPOFOL 10 MG/ML IV BOLUS
INTRAVENOUS | Status: DC | PRN
  Administered 2017-08-18 (×2): 20 mg via INTRAVENOUS

## 2017-08-18 MED ORDER — DEXAMETHASONE SODIUM PHOSPHATE 10 MG/ML IJ SOLN
10.0000 mg | Freq: Once | INTRAMUSCULAR | Status: AC
  Administered 2017-08-19: 10 mg via INTRAVENOUS
  Filled 2017-08-18: qty 1

## 2017-08-18 MED ORDER — LACTATED RINGERS IV SOLN
INTRAVENOUS | Status: DC
  Administered 2017-08-18 (×3): via INTRAVENOUS

## 2017-08-18 MED ORDER — MENTHOL 3 MG MT LOZG: 1.0000 | LOZENGE | OROMUCOSAL | Status: DC | PRN

## 2017-08-18 MED ORDER — TRANEXAMIC ACID 1000 MG/10ML IV SOLN
1000.0000 mg | Freq: Once | INTRAVENOUS | Status: AC
  Administered 2017-08-18: 1000 mg via INTRAVENOUS
  Filled 2017-08-18: qty 10

## 2017-08-18 MED ORDER — KETOROLAC TROMETHAMINE 15 MG/ML IJ SOLN
30.0000 mg | Freq: Four times a day (QID) | INTRAMUSCULAR | Status: AC
  Administered 2017-08-18 – 2017-08-19 (×4): 30 mg via INTRAVENOUS
  Filled 2017-08-18 (×4): qty 2

## 2017-08-18 MED ORDER — OXYCODONE HCL 5 MG PO TABS: 5.0000 mg | ORAL_TABLET | ORAL | 0 refills | Status: DC | PRN

## 2017-08-18 MED ORDER — LOSARTAN POTASSIUM 50 MG PO TABS
50.0000 mg | ORAL_TABLET | Freq: Every evening | ORAL | Status: DC
  Administered 2017-08-18: 50 mg via ORAL
  Filled 2017-08-18: qty 1

## 2017-08-18 MED ORDER — PHENOL 1.4 % MT LIQD: 1.0000 | OROMUCOSAL | Status: DC | PRN

## 2017-08-18 MED ORDER — FENTANYL CITRATE (PF) 100 MCG/2ML IJ SOLN
INTRAMUSCULAR | Status: AC
  Filled 2017-08-18: qty 2

## 2017-08-18 MED ORDER — GABAPENTIN 300 MG PO CAPS
300.0000 mg | ORAL_CAPSULE | Freq: Three times a day (TID) | ORAL | Status: DC
  Administered 2017-08-19: 300 mg via ORAL
  Filled 2017-08-18 (×2): qty 1

## 2017-08-18 MED ORDER — SODIUM CHLORIDE 0.9 % IV SOLN
INTRAVENOUS | Status: DC
  Administered 2017-08-18: 17:00:00 via INTRAVENOUS

## 2017-08-18 MED ORDER — CEFAZOLIN SODIUM-DEXTROSE 2-4 GM/100ML-% IV SOLN
2.0000 g | INTRAVENOUS | Status: AC
  Administered 2017-08-18: 2 g via INTRAVENOUS
  Filled 2017-08-18: qty 100

## 2017-08-18 MED ORDER — OXYCODONE HCL 5 MG PO TABS: 5.0000 mg | ORAL_TABLET | ORAL | Status: DC | PRN

## 2017-08-18 MED ORDER — ACETAMINOPHEN 325 MG PO TABS: 325.0000 mg | ORAL_TABLET | Freq: Four times a day (QID) | ORAL | Status: DC | PRN

## 2017-08-18 MED ORDER — PHENYLEPHRINE HCL 10 MG/ML IJ SOLN
INTRAMUSCULAR | Status: DC | PRN
  Administered 2017-08-18: 80 ug via INTRAVENOUS
  Administered 2017-08-18: 40 ug via INTRAVENOUS
  Administered 2017-08-18: 80 ug via INTRAVENOUS

## 2017-08-18 MED ORDER — METHOCARBAMOL 1000 MG/10ML IJ SOLN: 500.0000 mg | Freq: Four times a day (QID) | INTRAMUSCULAR | Status: DC | PRN

## 2017-08-18 MED ORDER — CLONAZEPAM 0.5 MG PO TABS: 0.5000 mg | ORAL_TABLET | Freq: Three times a day (TID) | ORAL | Status: DC | PRN

## 2017-08-18 MED ORDER — METOCLOPRAMIDE HCL 5 MG PO TABS: 5.0000 mg | ORAL_TABLET | Freq: Three times a day (TID) | ORAL | Status: DC | PRN

## 2017-08-18 MED ORDER — ALUM & MAG HYDROXIDE-SIMETH 200-200-20 MG/5ML PO SUSP: 30.0000 mL | ORAL | Status: DC | PRN

## 2017-08-18 MED ORDER — DEXMEDETOMIDINE HCL 200 MCG/2ML IV SOLN
INTRAVENOUS | Status: DC | PRN
  Administered 2017-08-18 (×3): 8 ug via INTRAVENOUS
  Administered 2017-08-18 (×2): 4 ug via INTRAVENOUS

## 2017-08-18 MED ORDER — LABETALOL HCL 100 MG PO TABS
100.0000 mg | ORAL_TABLET | Freq: Every evening | ORAL | Status: DC
  Filled 2017-08-18 (×2): qty 1

## 2017-08-18 MED ORDER — LIDOCAINE HCL (CARDIAC) PF 100 MG/5ML IV SOSY
PREFILLED_SYRINGE | INTRAVENOUS | Status: DC | PRN
  Administered 2017-08-18: 50 mg via INTRATRACHEAL

## 2017-08-18 MED ORDER — OXYCODONE HCL 5 MG/5ML PO SOLN: 5.0000 mg | Freq: Once | ORAL | Status: AC | PRN

## 2017-08-18 MED ORDER — ONDANSETRON HCL 4 MG/2ML IJ SOLN: 4.0000 mg | Freq: Four times a day (QID) | INTRAMUSCULAR | Status: DC | PRN

## 2017-08-18 MED ORDER — SENNOSIDES-DOCUSATE SODIUM 8.6-50 MG PO TABS: 1.0000 | ORAL_TABLET | Freq: Every evening | ORAL | 1 refills | Status: DC | PRN

## 2017-08-18 MED ORDER — SORBITOL 70 % SOLN: 30.0000 mL | Freq: Every day | Status: DC | PRN

## 2017-08-18 MED ORDER — 0.9 % SODIUM CHLORIDE (POUR BTL) OPTIME
TOPICAL | Status: DC | PRN
  Administered 2017-08-18: 1000 mL

## 2017-08-18 MED ORDER — ONDANSETRON HCL 4 MG PO TABS: 4.0000 mg | ORAL_TABLET | Freq: Three times a day (TID) | ORAL | 0 refills | Status: DC | PRN

## 2017-08-18 MED ORDER — PANTOPRAZOLE SODIUM 40 MG PO TBEC
40.0000 mg | DELAYED_RELEASE_TABLET | Freq: Every day | ORAL | Status: DC
  Administered 2017-08-18 – 2017-08-19 (×2): 40 mg via ORAL
  Filled 2017-08-18 (×2): qty 1

## 2017-08-18 MED ORDER — FENTANYL CITRATE (PF) 100 MCG/2ML IJ SOLN
25.0000 ug | INTRAMUSCULAR | Status: DC | PRN
Start: 2017-08-18 — End: 2017-08-18
  Administered 2017-08-18 (×2): 50 ug via INTRAVENOUS

## 2017-08-18 MED ORDER — OXYCODONE HCL 5 MG PO TABS
5.0000 mg | ORAL_TABLET | Freq: Once | ORAL | Status: AC | PRN
  Administered 2017-08-18: 5 mg via ORAL

## 2017-08-18 MED ORDER — METHOCARBAMOL 750 MG PO TABS: 750.0000 mg | ORAL_TABLET | Freq: Two times a day (BID) | ORAL | 0 refills | Status: DC | PRN

## 2017-08-18 MED ORDER — DOCUSATE SODIUM 100 MG PO CAPS
100.0000 mg | ORAL_CAPSULE | Freq: Two times a day (BID) | ORAL | Status: DC
  Administered 2017-08-18 – 2017-08-19 (×2): 100 mg via ORAL
  Filled 2017-08-18 (×2): qty 1

## 2017-08-18 MED ORDER — SODIUM CHLORIDE 0.9 % IR SOLN
Status: DC | PRN
  Administered 2017-08-18: 3000 mL

## 2017-08-18 MED ORDER — MIDAZOLAM HCL 2 MG/2ML IJ SOLN
INTRAMUSCULAR | Status: AC
  Filled 2017-08-18: qty 2

## 2017-08-18 MED ORDER — AMLODIPINE BESYLATE 5 MG PO TABS
5.0000 mg | ORAL_TABLET | Freq: Every day | ORAL | Status: DC
  Administered 2017-08-19: 5 mg via ORAL
  Filled 2017-08-18: qty 1

## 2017-08-18 MED ORDER — PROPOFOL 500 MG/50ML IV EMUL
INTRAVENOUS | Status: DC | PRN
  Administered 2017-08-18: 100 ug/kg/min via INTRAVENOUS

## 2017-08-18 MED ORDER — VANCOMYCIN HCL 1000 MG IV SOLR
INTRAVENOUS | Status: DC | PRN
  Administered 2017-08-18: 1000 mg

## 2017-08-18 MED ORDER — FENTANYL CITRATE (PF) 250 MCG/5ML IJ SOLN
INTRAMUSCULAR | Status: AC
  Filled 2017-08-18: qty 5

## 2017-08-18 MED ORDER — FAMOTIDINE 20 MG PO TABS
20.0000 mg | ORAL_TABLET | Freq: Every day | ORAL | Status: DC
  Administered 2017-08-18 – 2017-08-19 (×2): 20 mg via ORAL
  Filled 2017-08-18 (×2): qty 1

## 2017-08-18 MED ORDER — LEVOTHYROXINE SODIUM 100 MCG PO TABS
100.0000 ug | ORAL_TABLET | Freq: Every day | ORAL | Status: DC
  Administered 2017-08-19: 100 ug via ORAL
  Filled 2017-08-18: qty 1

## 2017-08-18 MED ORDER — ONDANSETRON HCL 4 MG PO TABS: 4.0000 mg | ORAL_TABLET | Freq: Four times a day (QID) | ORAL | Status: DC | PRN

## 2017-08-18 MED ORDER — DIPHENHYDRAMINE HCL 12.5 MG/5ML PO ELIX: 25.0000 mg | ORAL_SOLUTION | ORAL | Status: DC | PRN

## 2017-08-18 MED ORDER — ACETAMINOPHEN 500 MG PO TABS
1000.0000 mg | ORAL_TABLET | Freq: Four times a day (QID) | ORAL | Status: AC
  Administered 2017-08-18 – 2017-08-19 (×4): 1000 mg via ORAL
  Filled 2017-08-18 (×4): qty 2

## 2017-08-18 MED ORDER — NICOTINE 7 MG/24HR TD PT24
7.0000 mg | MEDICATED_PATCH | Freq: Every day | TRANSDERMAL | Status: DC
  Administered 2017-08-19: 7 mg via TRANSDERMAL
  Filled 2017-08-18: qty 1

## 2017-08-18 MED ORDER — ATORVASTATIN CALCIUM 10 MG PO TABS
10.0000 mg | ORAL_TABLET | Freq: Every day | ORAL | Status: DC
  Administered 2017-08-19: 10 mg via ORAL
  Filled 2017-08-18: qty 1

## 2017-08-18 SURGICAL SUPPLY — 49 items
BAG DECANTER FOR FLEXI CONT (MISCELLANEOUS) ×3 IMPLANT
CAPT HIP TOTAL 2 ×3 IMPLANT
CELLS DAT CNTRL 66122 CELL SVR (MISCELLANEOUS) IMPLANT
CLOSURE STERI-STRIP 1/2X4 (GAUZE/BANDAGES/DRESSINGS) ×1
CLSR STERI-STRIP ANTIMIC 1/2X4 (GAUZE/BANDAGES/DRESSINGS) ×2 IMPLANT
COVER SURGICAL LIGHT HANDLE (MISCELLANEOUS) ×3 IMPLANT
DRAPE C-ARM 42X72 X-RAY (DRAPES) ×3 IMPLANT
DRAPE POUCH INSTRU U-SHP 10X18 (DRAPES) ×3 IMPLANT
DRAPE STERI IOBAN 125X83 (DRAPES) ×3 IMPLANT
DRAPE U-SHAPE 47X51 STRL (DRAPES) ×6 IMPLANT
DRSG AQUACEL AG ADV 3.5X10 (GAUZE/BANDAGES/DRESSINGS) ×3 IMPLANT
DURAPREP 26ML APPLICATOR (WOUND CARE) ×3 IMPLANT
ELECT BLADE 4.0 EZ CLEAN MEGAD (MISCELLANEOUS) ×3
ELECT REM PT RETURN 9FT ADLT (ELECTROSURGICAL) ×3
ELECTRODE BLDE 4.0 EZ CLN MEGD (MISCELLANEOUS) ×1 IMPLANT
ELECTRODE REM PT RTRN 9FT ADLT (ELECTROSURGICAL) ×1 IMPLANT
GLOVE BIOGEL PI IND STRL 7.0 (GLOVE) ×1 IMPLANT
GLOVE BIOGEL PI INDICATOR 7.0 (GLOVE) ×2
GLOVE ECLIPSE 7.0 STRL STRAW (GLOVE) ×3 IMPLANT
GLOVE SKINSENSE NS SZ7.5 (GLOVE) ×2
GLOVE SKINSENSE STRL SZ7.5 (GLOVE) ×1 IMPLANT
GLOVE SURG SYN 7.5  E (GLOVE) ×4
GLOVE SURG SYN 7.5 E (GLOVE) ×2 IMPLANT
GOWN SRG XL XLNG 56XLVL 4 (GOWN DISPOSABLE) ×1 IMPLANT
GOWN STRL NON-REIN XL XLG LVL4 (GOWN DISPOSABLE) ×2
GOWN STRL REUS W/ TWL LRG LVL3 (GOWN DISPOSABLE) IMPLANT
GOWN STRL REUS W/TWL LRG LVL3 (GOWN DISPOSABLE)
HANDPIECE INTERPULSE COAX TIP (DISPOSABLE) ×2
HOOD PEEL AWAY FLYTE STAYCOOL (MISCELLANEOUS) ×6 IMPLANT
IV NS IRRIG 3000ML ARTHROMATIC (IV SOLUTION) ×3 IMPLANT
KIT BASIN OR (CUSTOM PROCEDURE TRAY) ×3 IMPLANT
MARKER SKIN DUAL TIP RULER LAB (MISCELLANEOUS) ×3 IMPLANT
NEEDLE SPNL 18GX3.5 QUINCKE PK (NEEDLE) ×3 IMPLANT
PACK TOTAL JOINT (CUSTOM PROCEDURE TRAY) ×3 IMPLANT
PACK UNIVERSAL I (CUSTOM PROCEDURE TRAY) ×3 IMPLANT
RTRCTR WOUND ALEXIS 18CM MED (MISCELLANEOUS)
SAW OSC TIP CART 19.5X105X1.3 (SAW) ×3 IMPLANT
SET HNDPC FAN SPRY TIP SCT (DISPOSABLE) ×1 IMPLANT
STAPLER VISISTAT 35W (STAPLE) IMPLANT
SUT ETHIBOND 2 V 37 (SUTURE) ×3 IMPLANT
SUT MON AB 4-0 PC3 18 (SUTURE) ×3 IMPLANT
SUT VIC AB 1 CT1 27 (SUTURE) ×2
SUT VIC AB 1 CT1 27XBRD ANBCTR (SUTURE) ×1 IMPLANT
SUT VIC AB 2-0 CT1 27 (SUTURE) ×4
SUT VIC AB 2-0 CT1 TAPERPNT 27 (SUTURE) ×2 IMPLANT
SYRINGE 60CC LL (MISCELLANEOUS) ×3 IMPLANT
TOWEL OR 17X26 10 PK STRL BLUE (TOWEL DISPOSABLE) ×3 IMPLANT
TRAY CATH 16FR W/PLASTIC CATH (SET/KITS/TRAYS/PACK) IMPLANT
YANKAUER SUCT BULB TIP NO VENT (SUCTIONS) ×3 IMPLANT

## 2017-08-18 NOTE — Anesthesia Postprocedure Evaluation (Signed)
Anesthesia Post Note  Patient: Lindsey Griffin  Procedure(s) Performed: RIGHT TOTAL HIP ARTHROPLASTY ANTERIOR APPROACH (Right Hip)     Patient location during evaluation: PACU Anesthesia Type: General Level of consciousness: oriented and awake and alert Pain management: pain level controlled Vital Signs Assessment: post-procedure vital signs reviewed and stable Respiratory status: spontaneous breathing, respiratory function stable and patient connected to nasal cannula oxygen Cardiovascular status: blood pressure returned to baseline and stable Postop Assessment: no headache, no backache and no apparent nausea or vomiting Anesthetic complications: no    Last Vitals:  Vitals:   08/18/17 1622 08/18/17 1926  BP: 109/84 104/70  Pulse: 68 77  Resp: 16 19  Temp: 36.6 C 36.5 C  SpO2: 100% 100%    Last Pain:  Vitals:   08/18/17 1926  TempSrc: Oral  PainSc:                  Lindsey Griffin

## 2017-08-18 NOTE — Transfer of Care (Signed)
Immediate Anesthesia Transfer of Care Note  Patient: Lindsey Griffin  Procedure(s) Performed: RIGHT TOTAL HIP ARTHROPLASTY ANTERIOR APPROACH (Right Hip)  Patient Location: PACU  Anesthesia Type:Spinal  Level of Consciousness: awake, alert  and oriented  Airway & Oxygen Therapy: Patient Spontanous Breathing and Patient connected to nasal cannula oxygen  Post-op Assessment: Report given to RN and Post -op Vital signs reviewed and stable  Post vital signs: Reviewed and stable  Last Vitals:  Vitals Value Taken Time  BP 90/60 08/18/2017  2:33 PM  Temp 36.4 C 08/18/2017  2:30 PM  Pulse 58 08/18/2017  2:35 PM  Resp 13 08/18/2017  2:35 PM  SpO2 99 % 08/18/2017  2:35 PM  Vitals shown include unvalidated device data.  Last Pain:  Vitals:   08/18/17 1430  TempSrc:   PainSc: 0-No pain      Patients Stated Pain Goal: 3 (08/18/17 1107)  Complications: No apparent anesthesia complications

## 2017-08-18 NOTE — Anesthesia Procedure Notes (Signed)
Spinal  Patient location during procedure: OR Start time: 08/18/2017 1:45 PM End time: 08/18/2017 1:50 PM Staffing Anesthesiologist: Kipp Brood, MD Performed: anesthesiologist  Spinal Block Patient position: sitting Prep: ChloraPrep Patient monitoring: heart rate, cardiac monitor, continuous pulse ox and blood pressure Approach: midline Location: L3-4 Injection technique: single-shot Needle Needle type: Pencan  Needle gauge: 24 G Assessment Sensory level: T6 Additional Notes 14 mg 0.75% bupivacaine injected easily

## 2017-08-18 NOTE — Anesthesia Preprocedure Evaluation (Signed)
Anesthesia Evaluation  Patient identified by MRN, date of birth, ID band Patient awake    Reviewed: Allergy & Precautions, NPO status , Patient's Chart, lab work & pertinent test results  Airway Mallampati: II  TM Distance: >3 FB Neck ROM: Full    Dental  (+) Teeth Intact, Dental Advisory Given   Pulmonary Current Smoker,    breath sounds clear to auscultation       Cardiovascular hypertension,  Rhythm:Regular Rate:Normal     Neuro/Psych    GI/Hepatic   Endo/Other    Renal/GU      Musculoskeletal   Abdominal   Peds  Hematology   Anesthesia Other Findings   Reproductive/Obstetrics                             Anesthesia Physical Anesthesia Plan  ASA: III  Anesthesia Plan: General   Post-op Pain Management:    Induction: Intravenous  PONV Risk Score and Plan: 1 and Ondansetron and Dexamethasone  Airway Management Planned: Oral ETT  Additional Equipment:   Intra-op Plan:   Post-operative Plan: Extubation in OR  Informed Consent: I have reviewed the patients History and Physical, chart, labs and discussed the procedure including the risks, benefits and alternatives for the proposed anesthesia with the patient or authorized representative who has indicated his/her understanding and acceptance.   Dental advisory given  Plan Discussed with: CRNA and Anesthesiologist  Anesthesia Plan Comments:         Anesthesia Quick Evaluation  

## 2017-08-18 NOTE — H&P (Signed)
PREOPERATIVE H&P  Chief Complaint: right hip avascular necrosis  HPI: Lindsey Griffin is a 42 y.o. female who presents for surgical treatment of right hip avascular necrosis.  She denies any changes in medical history.  Past Medical History:  Diagnosis Date  . Anxiety and depression   . Arthritis   . CHF (congestive heart failure) (HCC)    pt reports episode after birth of son in July 2017  . GERD (gastroesophageal reflux disease)   . Hypertension   . Hypothyroidism   . Migraine headache   . Mild obesity   . Pre-eclampsia   . RLS (restless legs syndrome)    Past Surgical History:  Procedure Laterality Date  . BREAST LUMPECTOMY Right   . CHOLECYSTECTOMY    . DENTAL SURGERY    . GALLBLADDER SURGERY  2004   Social History   Socioeconomic History  . Marital status: Married    Spouse name: Marthann Schiller  . Number of children: 1  . Years of education: 65  . Highest education level: Not on file  Occupational History  . Occupation: Unemployed  Social Needs  . Financial resource strain: Not on file  . Food insecurity:    Worry: Not on file    Inability: Not on file  . Transportation needs:    Medical: Not on file    Non-medical: Not on file  Tobacco Use  . Smoking status: Current Every Day Smoker    Packs/day: 0.50    Types: Cigarettes  . Smokeless tobacco: Never Used  Substance and Sexual Activity  . Alcohol use: Yes    Comment: Consumes 2 or 3 alcholic beverages per month   . Drug use: No  . Sexual activity: Not Currently  Lifestyle  . Physical activity:    Days per week: Not on file    Minutes per session: Not on file  . Stress: Not on file  Relationships  . Social connections:    Talks on phone: Not on file    Gets together: Not on file    Attends religious service: Not on file    Active member of club or organization: Not on file    Attends meetings of clubs or organizations: Not on file    Relationship status: Not on file  Other Topics Concern  . Not  on file  Social History Narrative   Patient is married Dance movement psychotherapist)    Patient has a Naval architect.   Patient is disabled.   Patient has one child.   Patient is left-handed.   Patient does not drink any caffeine.            Family History  Problem Relation Age of Onset  . Headache Mother   . Hyperthyroidism Mother   . COPD Mother   . Hypertension Mother   . Uterine cancer Mother   . Alcohol abuse Mother   . Bipolar disorder Mother   . Hypertension Father   . Hypercholesterolemia Father   . Heart attack Father   . Asthma Father   . Alcohol abuse Father   . Migraines Sister   . Schizophrenia Sister   . Stroke Brother   . Alcohol abuse Brother   . Hypothyroidism Sister   . Hypertension Sister   . Hypertension Sister   . Fibroids Sister   . Bipolar disorder Sister   . Fibroids Sister    Allergies  Allergen Reactions  . Dust Mite Extract   . Peanuts [Peanut Oil]  Positive on allergy test, no specific reaction to document  . Pollen Extract   . Topamax [Topiramate]     Tingling in mouth, tongue, throat    Prior to Admission medications   Medication Sig Start Date End Date Taking? Authorizing Provider  acetaminophen (TYLENOL) 500 MG tablet Take 1,000 mg by mouth every 6 (six) hours.    Yes [provider]  amLODipine (NORVASC) 5 MG tablet Take 5 mg by mouth daily.   Yes [provider]  atorvastatin (LIPITOR) 10 MG tablet Take 10 mg by mouth daily.   Yes [provider]  clonazePAM (KLONOPIN) 1 MG tablet Take 1 tablet (1 mg total) by mouth 3 (three) times daily. take 1 tablet by mouth three times a day if needed for anxiety Patient taking differently: Take 0.5-1 mg by mouth 3 (three) times daily as needed for anxiety.  01/28/15  Yes Myrlene Broker, MD  Coenzyme Q10 (CO Q 10) 100 MG CAPS Take 100 mg by mouth daily.    Yes [provider]  Erenumab-aooe (AIMOVIG) 70 MG/ML SOAJ Inject 140 mg into the skin every 30 (thirty) days.   Yes  [provider]  labetalol (NORMODYNE) 100 MG tablet Take 100 mg by mouth every evening.   Yes [provider]  levothyroxine (SYNTHROID, LEVOTHROID) 100 MCG tablet Take 100 mcg by mouth daily before breakfast.   Yes [provider]  losartan (COZAAR) 100 MG tablet Take 50 mg by mouth every evening.   Yes [provider]  omeprazole (PRILOSEC) 20 MG capsule Take 20 mg by mouth daily.   Yes [provider]  promethazine (PHENERGAN) 25 MG tablet Take 25 mg by mouth every 8 (eight) hours as needed for nausea or vomiting.    Yes [provider]  ranitidine (ZANTAC) 75 MG tablet Take 75 mg by mouth daily.   Yes [provider]  traMADol (ULTRAM) 50 MG tablet Take 100 mg by mouth 4 (four) times daily as needed for severe pain.    Yes [provider]  triamcinolone cream (KENALOG) 0.1 % Apply 1 application topically daily as needed (eczema).   Yes [provider]  nicotine (NICODERM CQ - DOSED IN MG/24 HR) 7 mg/24hr patch Apply 1 patch  daily 08/15/17   Tarry Kos, MD     Positive ROS: All other systems have been reviewed and were otherwise negative with the exception of those mentioned in the HPI and as above.  Physical Exam: General: Alert, no acute distress Cardiovascular: No pedal edema Respiratory: No cyanosis, no use of accessory musculature GI: abdomen soft Skin: No lesions in the area of chief complaint Neurologic: Sensation intact distally Psychiatric: Patient is competent for consent with normal mood and affect Lymphatic: no lymphedema  MUSCULOSKELETAL: exam stable  Assessment: right hip avascular necrosis  Plan: Plan for Procedure(s): RIGHT TOTAL HIP ARTHROPLASTY ANTERIOR APPROACH  The risks benefits and alternatives were discussed with the patient including but not limited to the risks of nonoperative treatment, versus surgical intervention including infection, bleeding, nerve injury,  blood  clots, cardiopulmonary complications, morbidity, mortality, among others, and they were willing to proceed.   Preoperative templating of the joint replacement has been completed, documented, and submitted to the Operating Room personnel in order to optimize intra-operative equipment management.  Glee Arvin, MD   08/18/2017 10:37 AM

## 2017-08-18 NOTE — Op Note (Signed)
RIGHT TOTAL HIP ARTHROPLASTY ANTERIOR APPROACH  Procedure Note LABREA ECCLESTON   [id number]  Pre-op Diagnosis: right hip avascular necrosis     Post-op Diagnosis: same   Operative Procedures  1. Total hip replacement; Right hip; uncemented [id number]   Personnel  Surgeon(s): Tarry Kos, MD  Assist: Oneal Grout, PA-C; necessary for the timely completion of procedure and due to complexity of procedure.   Anesthesia: spinal  Prosthesis: Depuy Acetabulum: Pinnacle 54 mm Femur: Corail KA 12 Head: 36 mm size: +1.5 Liner: +4 neutral Bearing Type: ceramic on poly  Total Hip Arthroplasty (Anterior Approach) Op Note:  After informed consent was obtained and the operative extremity marked in the holding area, the patient was brought back to the operating room and placed supine on the HANA table. Next, the operative extremity was prepped and draped in normal sterile fashion. Surgical timeout occurred verifying patient identification, surgical site, surgical procedure and administration of antibiotics.  A modified anterior Smith-Peterson approach to the hip was performed, using the interval between tensor fascia lata and sartorius.  Dissection was carried bluntly down onto the anterior hip capsule. The lateral femoral circumflex vessels were identified and coagulated. A capsulotomy was performed and the capsular flaps tagged for later repair.  Fluoroscopy was utilized to prepare for the femoral neck cut. The neck osteotomy was performed. The femoral head was removed, the acetabular rim was cleared of soft tissue and attention was turned to reaming the acetabulum.  Sequential reaming was performed under fluoroscopic guidance. We reamed to a size 53 mm, and then impacted the acetabular shell. The liner was then placed after irrigation and attention turned to the femur.  After placing the femoral hook, the leg was taken to externally rotated, extended and adducted position taking care to  perform soft tissue releases to allow for adequate mobilization of the femur. Soft tissue was cleared from the shoulder of the greater trochanter and the hook elevator used to improve exposure of the proximal femur. Sequential broaching performed up to a size 12. Trial neck and head were placed. The leg was brought back up to neutral and the construct reduced. The position and sizing of components, offset and leg lengths were checked using fluoroscopy. Stability of the construct was checked in extension and external rotation without any subluxation or impingement of prosthesis. We dislocated the prosthesis, dropped the leg back into position, removed trial components, and irrigated copiously. The final stem and head was then placed, the leg brought back up, the system reduced and fluoroscopy used to verify positioning.  We irrigated, obtained hemostasis and closed the capsule using #2 ethibond suture.  One gram of vancomycin powder was placed in the surgical bed. The fascia was closed with #1 vicryl plus, the deep fat layer was closed with 0 vicryl, the subcutaneous layers closed with 2.0 Vicryl Plus and the skin closed with 3.0 monocryl and steri strips. A sterile dressing was applied. The patient was awakened in the operating room and taken to recovery in stable condition.  All sponge, needle, and instrument counts were correct at the end of the case.   Position: supine  Complications: none.  Time Out: performed   Drains/Packing: none  Estimated blood loss: 100 cc  Returned to Recovery Room: in good condition.   Antibiotics: yes   Mechanical VTE (DVT) Prophylaxis: sequential compression devices, TED thigh-high  Chemical VTE (DVT) Prophylaxis: aspirin   Fluid Replacement: see anesthesia record  Specimens Removed: 1 to pathology   Sponge  and Instrument Count Correct? yes   PACU: portable radiograph - low AP   Admission: inpatient status  Plan/RTC: Return in 2 weeks for staple  removal. Weight Bearing/Load Lower Extremity: full  Hip precautions: none Suture Removal: 10-14 days  Betadine to incision twice daily once dressing is removed on POD#7  N. Glee Arvin, MD [facility] [phone] 1:57 PM     Implant Name Type Inv. Item Serial No. Manufacturer Lot No. LRB No. Used  LINER NEUTRAL [id number] PLUS 4 - [id number] Hips LINER NEUTRAL [id number] PLUS 4  DEPUY SYNTHES [id number] Right 1  ACETABULAR CUP Cathe Mons - [id number] Plate ACETABULAR CUP W GRIPTION  DEPUY SYNTHES [id number] Right 1  HEAD CERAMIC DELTA 36 PLUS 1.5 - [id number] Hips HEAD CERAMIC DELTA 36 PLUS 1.5  DEPUY SYNTHES [id number] Right 1  STEM CORAIL KA12 - [id number] Stem STEM CORAIL KA12  DEPUY SYNTHES [id number] Right 1

## 2017-08-18 NOTE — Discharge Instructions (Signed)

## 2017-08-18 NOTE — Evaluation (Signed)
Physical Therapy Evaluation Patient Details Name: Lindsey Griffin MRN: [id number] DOB: [birth year] Today's Date: 08/18/2017   History of Present Illness  42 y.o. female admitted on 08/18/17 for elective R direct ant THA due to AVN.  Pt with significant PMH of chronic migraines, RLS, HTN, and CHF.    Clinical Impression  Pt is POD #0 and is moving well, able to walk around the room with min guard assist overall with RW.  She will likely progress well enough to d/c home with her family's assist.  We will try her rollator tomorrow to make sure it provides enough support.   PT to follow acutely for deficits listed below.       Follow Up Recommendations Follow surgeon's recommendation for DC plan and follow-up therapies;Supervision for mobility/OOB    Equipment Recommendations  None recommended by PT    Recommendations for Other Services   NA    Precautions / Restrictions Restrictions RLE Weight Bearing: Weight bearing as tolerated      Mobility  Bed Mobility Overal bed mobility: Needs Assistance Bed Mobility: Supine to Sit     Supine to sit: Modified independent (Device/Increase time);HOB elevated     General bed mobility comments: Pt using hands to move her right leg to EOB, but otherwise able to move herself.    Transfers Overall transfer level: Needs assistance Equipment used: Rolling walker (2 wheeled) Transfers: Sit to/from Stand Sit to Stand: Min guard         General transfer comment: Min guard assist for safety, verbal cues for safe use of RW and hand placement.  Pt standing before PT was ready.   Ambulation/Gait Ambulation/Gait assistance: Min assist Ambulation Distance (Feet): 25 Feet Assistive device: Rolling walker (2 wheeled) Gait Pattern/deviations: Step-to pattern;Antalgic     General Gait Details: Pt with modeately antalgic gait pattern, verbal cues for safe RW use.          Balance Overall balance assessment: Needs assistance Sitting-balance  support: Feet supported;No upper extremity supported Sitting balance-Leahy Scale: Good     Standing balance support: Bilateral upper extremity supported;No upper extremity supported Standing balance-Leahy Scale: Fair                               Pertinent Vitals/Pain Pain Assessment: 0-10 Pain Score: 10-Worst pain ever Pain Location: right hip Pain Descriptors / Indicators: Aching;Burning Pain Intervention(s): Limited activity within patient's tolerance;Monitored during session;Repositioned    Home Living Family/patient expects to be discharged to:: Private residence Living Arrangements: Spouse/significant other Available Help at Discharge: Family;Available 24 hours/day(husband took 2 weeks off of work. ) Type of Home: House       Home Layout: One level Home Equipment: Environmental consultant - 4 wheels;Toilet riser;Cane - single point      Prior Function Level of Independence: Independent with assistive device(s)         Comments: at times had to use rollator for walking        Extremity/Trunk Assessment   Upper Extremity Assessment Upper Extremity Assessment: Overall WFL for tasks assessed    Lower Extremity Assessment Lower Extremity Assessment: RLE deficits/detail RLE Deficits / Details: right leg with normal post op pain and weakness, ankle 3/5, knee 3-/5 hip 2/5 per gross functional assessment.     Cervical / Trunk Assessment Cervical / Trunk Assessment: Normal  Communication   Communication: No difficulties  Cognition Arousal/Alertness: Awake/alert Behavior During Therapy: WFL for tasks assessed/performed Overall Cognitive  Status: Within Functional Limits for tasks assessed                                           Exercises Total Joint Exercises Ankle Circles/Pumps: AROM;Both;20 reps   Assessment/Plan    PT Assessment Patient needs continued PT services  PT Problem List Decreased strength;Decreased range of motion;Decreased  activity tolerance;Decreased balance;Decreased mobility;Decreased knowledge of use of DME;Pain       PT Treatment Interventions DME instruction;Gait training;Stair training;Functional mobility training;Therapeutic activities;Therapeutic exercise;Balance training;Patient/family education;Manual techniques;Modalities    PT Goals (Current goals can be found in the Care Plan section)  Acute Rehab PT Goals Patient Stated Goal: to be able to pick up her 21 year old son again.  PT Goal Formulation: With patient Time For Goal Achievement: 08/25/17 Potential to Achieve Goals: Good    Frequency 7X/week           AM-PAC PT "6 Clicks" Daily Activity  Outcome Measure Difficulty turning over in bed (including adjusting bedclothes, sheets and blankets)?: A Little Difficulty moving from lying on back to sitting on the side of the bed? : A Little Difficulty sitting down on and standing up from a chair with arms (e.g., wheelchair, bedside commode, etc,.)?: A Little Help needed moving to and from a bed to chair (including a wheelchair)?: A Little Help needed walking in hospital room?: A Little Help needed climbing 3-5 steps with a railing? : A Little 6 Click Score: 18    End of Session   Activity Tolerance: Patient limited by pain Patient left: in chair;with call bell/phone within reach;with family/visitor present Nurse Communication: Mobility status PT Visit Diagnosis: Muscle weakness (generalized) (M62.81);Difficulty in walking, not elsewhere classified (R26.2);Pain Pain - Right/Left: Right Pain - part of body: Hip    Time: 1308-6578 PT Time Calculation (min) (ACUTE ONLY): 35 min   Charges:          Lurena Joiner B. Braheem Tomasik, PT, DPT [phone]   PT Evaluation $PT Eval Moderate Complexity: 1 Mod PT Treatments $Gait Training: 8-22 mins   08/18/2017, 10:00 PM

## 2017-08-19 ENCOUNTER — Encounter (HOSPITAL_COMMUNITY): Payer: Self-pay | Admitting: General Practice

## 2017-08-19 ENCOUNTER — Other Ambulatory Visit: Payer: Self-pay

## 2017-08-19 LAB — BASIC METABOLIC PANEL
Anion gap: 8 (ref 5–15)
BUN: 7 mg/dL (ref 6–20)
CHLORIDE: 108 mmol/L (ref 101–111)
CO2: 23 mmol/L (ref 22–32)
Calcium: 8.3 mg/dL — ABNORMAL LOW (ref 8.9–10.3)
Creatinine, Ser: 0.77 mg/dL (ref 0.44–1.00)
GFR calc Af Amer: >60 mL/min (ref >60)
GFR calc non Af Amer: >60 mL/min (ref >60)
GLUCOSE: 147 mg/dL — AB (ref 65–99)
POTASSIUM: 3.8 mmol/L (ref 3.5–5.1)
Sodium: 139 mmol/L (ref 135–145)

## 2017-08-19 LAB — CBC
HEMATOCRIT: 34.1 % — AB (ref 36.0–46.0)
HEMOGLOBIN: 11.6 g/dL — AB (ref 12.0–15.0)
MCH: 31.9 pg (ref 26.0–34.0)
MCHC: 34 g/dL (ref 30.0–36.0)
MCV: 93.7 fL (ref 78.0–100.0)
Platelets: 266 10*3/uL (ref 150–400)
RBC: 3.64 MIL/uL — ABNORMAL LOW (ref 3.87–5.11)
RDW: 12.1 % (ref 11.5–15.5)
WBC: 11.5 10*3/uL — AB (ref 4.0–10.5)

## 2017-08-19 NOTE — Social Work (Signed)
CSW acknowledging consult for SNF placement, CSW aware pt discharging home with the support of her family. CSW signing off. Please consult if any additional needs arise.  Doy Hutching, LCSWA [facility] Clinical Social Work [phone]

## 2017-08-19 NOTE — Progress Notes (Signed)
Physical Therapy Treatment Patient Details Name: Lindsey Griffin MRN: [id number] DOB: 08-01-1975 Today's Date: 08/19/2017    History of Present Illness 42 y.o. female admitted on 08/18/17 for elective R direct ant THA due to AVN.  Pt with significant PMH of chronic migraines, RLS, HTN, and CHF.      PT Comments    Pt was seen for evaluation of tolerance of gait on rollator then on to stairs with good effort.  Her plan is to continue on with HHPT and will follow instructions as her MD has ordered.  Good technique with stairs andwill anticipate a smooth transition to next venue of care.  Pt is given ice for pain management at end of session.    Follow Up Recommendations  Follow surgeon's recommendation for DC plan and follow-up therapies;Supervision for mobility/OOB     Equipment Recommendations  None recommended by PT    Recommendations for Other Services       Precautions / Restrictions Precautions Precautions: Fall Restrictions Weight Bearing Restrictions: Yes RLE Weight Bearing: Weight bearing as tolerated    Mobility  Bed Mobility               General bed mobility comments: up in chair when PT arrived  Transfers Overall transfer level: Needs assistance Equipment used: Rolling walker (2 wheeled) Transfers: Sit to/from Stand Sit to Stand: Min guard         General transfer comment: min guard with rollator, reminders for hand placment and safety  Ambulation/Gait Ambulation/Gait assistance: Min guard;Min assist Ambulation Distance (Feet): 130 Feet Assistive device: Rolling walker (2 wheeled) Gait Pattern/deviations: Step-to pattern;Step-through pattern;Decreased stride length;Antalgic;Wide base of support Gait velocity: reduced Gait velocity interpretation: <1.31 ft/sec, indicative of household ambulator     Stairs Stairs: Yes Stairs assistance: Min guard Stair Management: Two rails;Step to pattern;Forwards Number of Stairs: 10     Wheelchair  Mobility    Modified Rankin (Stroke Patients Only)       Balance Overall balance assessment: Needs assistance Sitting-balance support: Feet supported Sitting balance-Leahy Scale: Good     Standing balance support: Bilateral upper extremity supported;During functional activity Standing balance-Leahy Scale: Fair                              Cognition Arousal/Alertness: Awake/alert Behavior During Therapy: WFL for tasks assessed/performed Overall Cognitive Status: Within Functional Limits for tasks assessed                                        Exercises Total Joint Exercises Ankle Circles/Pumps: AROM;Both;5 reps Quad Sets: AROM;Both;10 reps Gluteal Sets: AROM;Both;10 reps    General Comments        Pertinent Vitals/Pain Pain Assessment: 0-10 Pain Score: 5  Pain Location: right hip Pain Descriptors / Indicators: Aching;Burning Pain Intervention(s): Monitored during session;Premedicated before session;Repositioned;Ice applied    Home Living                      Prior Function            PT Goals (current goals can now be found in the care plan section) Acute Rehab PT Goals Patient Stated Goal: to be able to pick up her 69 year old son again.  Progress towards PT goals: Progressing toward goals    Frequency    7X/week  PT Plan      Co-evaluation              AM-PAC PT "6 Clicks" Daily Activity  Outcome Measure  Difficulty turning over in bed (including adjusting bedclothes, sheets and blankets)?: A Little Difficulty moving from lying on back to sitting on the side of the bed? : A Little Difficulty sitting down on and standing up from a chair with arms (e.g., wheelchair, bedside commode, etc,.)?: A Little Help needed moving to and from a bed to chair (including a wheelchair)?: A Little Help needed walking in hospital room?: A Little Help needed climbing 3-5 steps with a railing? : A Little 6 Click Score:  18    End of Session Equipment Utilized During Treatment: Gait belt Activity Tolerance: No increased pain;Patient limited by fatigue Patient left: in chair;with call bell/phone within reach Nurse Communication: Mobility status PT Visit Diagnosis: Muscle weakness (generalized) (M62.81);Difficulty in walking, not elsewhere classified (R26.2);Pain Pain - Right/Left: Right Pain - part of body: Hip     Time: 1610-9604 PT Time Calculation (min) (ACUTE ONLY): 23 min  Charges:  $Gait Training: 8-22 mins $Therapeutic Exercise: 8-22 mins                    G Codes:  Functional Assessment Tool Used: AM-PAC 6 Clicks Basic Mobility     Ivar Drape 08/19/2017, 6:01 PM   Samul Dada, PT MS Acute Rehab Dept. Number: [facility] [id number] and [facility] [phone]

## 2017-08-19 NOTE — Discharge Summary (Signed)
Patient ID: Lindsey Griffin MRN: [id number] DOB/AGE: 30-Nov-1975 42 y.o.  Admit date: 08/18/2017 Discharge date: 08/19/2017  Admission Diagnoses:  Active Problems:   History of hip replacement   Discharge Diagnoses:  Same  Past Medical History:  Diagnosis Date  . Anxiety and depression   . Arthritis   . CHF (congestive heart failure) (HCC)    pt reports episode after birth of son in July 2017  . GERD (gastroesophageal reflux disease)   . Hypertension   . Hypothyroidism   . Migraine headache   . Mild obesity   . Pre-eclampsia   . RLS (restless legs syndrome)     Surgeries: Procedure(s): RIGHT TOTAL HIP ARTHROPLASTY ANTERIOR APPROACH on 08/18/2017   Consultants:   Discharged Condition: Improved  Hospital Course: Lindsey Griffin is an 42 y.o. 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 Patient has severe unremitting pain that affects sleep, daily activities, and work/hobbies. After pre-op clearance the patient was taken to the operating room on 08/18/2017 and underwent  Procedure(s): RIGHT TOTAL HIP ARTHROPLASTY ANTERIOR APPROACH.    Patient was given perioperative antibiotics:  Anti-infectives (From admission, onward)   Start     Dose/Rate Route Frequency Ordered Stop   08/18/17 1830  ceFAZolin (ANCEF) IVPB 2g/100 mL premix     2 g 200 mL/hr over 30 Minutes Intravenous Every 6 hours 08/18/17 1553 08/19/17 0635   08/18/17 1354  vancomycin (VANCOCIN) powder  Status:  Discontinued       As needed 08/18/17 1355 08/18/17 1425   08/18/17 1015  ceFAZolin (ANCEF) IVPB 2g/100 mL premix     2 g 200 mL/hr over 30 Minutes Intravenous On call to O.R. 08/18/17 1005 08/18/17 1231       Patient was given sequential compression devices, early ambulation, and chemoprophylaxis to prevent DVT.  Patient benefited maximally from hospital stay and there were no complications.    Recent vital signs:  Patient Vitals for the past 24 hrs:  BP  Temp Temp src Pulse Resp SpO2 Height Weight  08/19/17 0351 (!) 111/58 97.6 F (36.4 C) Oral (!) 59 - 99 % - -  08/19/17 0006 (!) 97/58 97.8 F (36.6 C) Oral 63 - 99 % - -  08/18/17 1926 104/70 97.7 F (36.5 C) Oral 77 19 100 % - -  08/18/17 1622 109/84 97.8 F (36.6 C) Oral 68 16 100 % - -  08/18/17 1612 - 97.7 F (36.5 C) - - - - - -  08/18/17 1600 103/84 - - 62 13 100 % - -  08/18/17 1545 113/69 - - 72 10 93 % - -  08/18/17 1530 110/88 - - (!) 59 13 98 % - -  08/18/17 1515 102/79 - - (!) 59 15 100 % - -  08/18/17 1500 - - - 65 11 100 % - -  08/18/17 1459 110/77 - - 64 10 100 % - -  08/18/17 1445 96/69 - - 65 10 100 % - -  08/18/17 1433 90/60 - - 65 19 99 % - -  08/18/17 1431 (!) 69/58 - - 63 17 98 % - -  08/18/17 1430 (!) 76/48 97.6 F (36.4 C) - 62 15 99 % - -  08/18/17 1055 113/66 98 F (36.7 C) Oral 84 20 99 %  (1.676 m) 149 lb 11.2 oz (67.9 kg)     Recent laboratory studies:  Recent Labs    08/19/17 0420  WBC 11.5*  HGB 11.6*  HCT 34.1*  PLT 266  NA 139  K 3.8  CL 108  CO2 23  BUN 7  CREATININE 0.77  GLUCOSE 147*  CALCIUM 8.3*     Discharge Medications:   Allergies as of 08/19/2017      Reactions   Dust Mite Extract    Gabapentin Other (See Comments)   Causes paralysis   Peanuts [peanut Oil]    Positive on allergy test, no specific reaction to document   Pollen Extract    Topamax [topiramate]    Tingling in mouth, tongue, throat       Medication List    STOP taking these medications   acetaminophen 500 MG tablet Commonly known as:  TYLENOL   clonazePAM 1 MG tablet Commonly known as:  KLONOPIN   traMADol 50 MG tablet Commonly known as:  ULTRAM     TAKE these medications   AIMOVIG 70 MG/ML Soaj Generic drug:  Erenumab-aooe Inject 140 mg into the skin every 30 (thirty) days.   amLODipine 5 MG tablet Commonly known as:  NORVASC Take 5 mg by mouth daily.   aspirin EC 81 MG tablet Take 1 tablet (81 mg total) by mouth 2 (two) times  daily.   atorvastatin 10 MG tablet Commonly known as:  LIPITOR Take 10 mg by mouth daily.   Co Q 10 100 MG Caps Take 100 mg by mouth daily.   labetalol 100 MG tablet Commonly known as:  NORMODYNE Take 100 mg by mouth every evening.   levothyroxine 100 MCG tablet Commonly known as:  SYNTHROID, LEVOTHROID Take 100 mcg by mouth daily before breakfast.   losartan 100 MG tablet Commonly known as:  COZAAR Take 50 mg by mouth every evening.   methocarbamol 750 MG tablet Commonly known as:  ROBAXIN Take 1 tablet (750 mg total) by mouth 2 (two) times daily as needed for muscle spasms.   nicotine 7 mg/24hr patch Commonly known as:  NICODERM CQ - dosed in mg/24 hr Apply 1 patch  daily   omeprazole 20 MG capsule Commonly known as:  PRILOSEC Take 20 mg by mouth daily.   ondansetron 4 MG tablet Commonly known as:  ZOFRAN Take 1-2 tablets (4-8 mg total) by mouth every 8 (eight) hours as needed for nausea or vomiting.   oxyCODONE 10 mg 12 hr tablet Commonly known as:  OXYCONTIN Take 1 tablet (10 mg total) by mouth every 12 (twelve) hours for 3 days.   oxyCODONE 5 MG immediate release tablet Commonly known as:  Oxy IR/ROXICODONE Take 1-3 tablets (5-15 mg total) by mouth every 4 (four) hours as needed.   promethazine 25 MG tablet Commonly known as:  PHENERGAN Take 25 mg by mouth every 8 (eight) hours as needed for nausea or vomiting.   ranitidine 75 MG tablet Commonly known as:  ZANTAC Take 75 mg by mouth daily.   senna-docusate 8.6-50 MG tablet Commonly known as:  SENOKOT S Take 1 tablet by mouth at bedtime as needed.   triamcinolone cream 0.1 % Commonly known as:  KENALOG Apply 1 application topically daily as needed (eczema).            Durable Medical Equipment  (From admission, onward)        Start     Ordered   08/18/17 1554  DME Walker rolling  Once    Question:  Patient needs a walker to treat with the following condition  Answer:  History of hip  replacement  08/18/17 1553   08/18/17 1554  DME 3 n 1  Once     08/18/17 1553   08/18/17 1554  DME Bedside commode  Once    Question:  Patient needs a bedside commode to treat with the following condition  Answer:  History of hip replacement   08/18/17 1553      Diagnostic Studies: Dg Chest 2 View  Result Date: 08/06/2017 CLINICAL DATA:  Preoperative study prior to hip arthroplasty. EXAM: CHEST - 2 VIEW COMPARISON:  None. FINDINGS: The heart size and mediastinal contours are within normal limits. Both lungs are clear. The visualized skeletal structures are unremarkable. IMPRESSION: No active cardiopulmonary disease. Electronically Signed   By: Gerome Sam III M.D   On: 08/06/2017 12:29   Dg Pelvis Portable  Result Date: 08/18/2017 CLINICAL DATA:  Post RIGHT total hip arthroplasty EXAM: PORTABLE PELVIS 1-2 VIEWS COMPARISON:  Portable exam 1523 hours compared to 06/27/2017 FINDINGS: Diffuse osseous demineralization. New RIGHT hip prosthesis identified without acute fracture or dislocation. LEFT hip joint space preserved with mixed lucent and sclerotic foci at the LEFT femoral head likely representing avascular necrosis. Pelvis intact. IMPRESSION: New RIGHT hip prosthesis without acute fracture or dislocation. Probable avascular necrosis of the LEFT femoral head. Electronically Signed   By: Ulyses Southward M.D.   On: 08/18/2017 15:35   Dg C-arm 1-60 Min  Result Date: 08/18/2017 CLINICAL DATA:  Status post RIGHT total hip arthroplasty. EXAM: OPERATIVE RIGHT HIP (WITH PELVIS IF PERFORMED) 6 VIEWS TECHNIQUE: Fluoroscopic spot image(s) were submitted for interpretation post-operatively. COMPARISON:  06/27/2017 radiograph FINDINGS: Intraoperative spot films of the RIGHT hip demonstrate interval RIGHT total hip arthroplasty without definite complicating features identified. IMPRESSION: RIGHT total hip arthroplasty without definite complicating features. Electronically Signed   By: Harmon Pier M.D.   On:  08/18/2017 14:58   Dg Hip Operative Unilat W Or W/o Pelvis Right  Result Date: 08/18/2017 CLINICAL DATA:  Status post RIGHT total hip arthroplasty. EXAM: OPERATIVE RIGHT HIP (WITH PELVIS IF PERFORMED) 6 VIEWS TECHNIQUE: Fluoroscopic spot image(s) were submitted for interpretation post-operatively. COMPARISON:  06/27/2017 radiograph FINDINGS: Intraoperative spot films of the RIGHT hip demonstrate interval RIGHT total hip arthroplasty without definite complicating features identified. IMPRESSION: RIGHT total hip arthroplasty without definite complicating features. Electronically Signed   By: Harmon Pier M.D.   On: 08/18/2017 14:58    Disposition: Discharge disposition: 01-Home or Self Care         Follow-up Information    Tarry Kos, MD In 2 weeks.   Specialty:  Orthopedic Surgery Why:  For suture removal, For wound re-check Contact information: [address] [place] [address] [phone]            Signed: Cristie Hem 08/19/2017, 8:01 AM

## 2017-08-19 NOTE — Progress Notes (Signed)
Subjective: 1 Day Post-Op Procedure(s) (LRB): RIGHT TOTAL HIP ARTHROPLASTY ANTERIOR APPROACH (Right) Patient reports pain as mild.  Doing well this am.  C/o migraine but this is getting better  Objective: Vital signs in last 24 hours: Temp:  [97.6 F (36.4 C)-98 F (36.7 C)] 97.6 F (36.4 C) (05/24 0351) Pulse Rate:  [59-84] 59 (05/24 0351) Resp:  [10-20] 19 (05/23 1926) BP: (69-113)/(48-88) 111/58 (05/24 0351) SpO2:  [93 %-100 %] 99 % (05/24 0351) Weight:  [149 lb 11.2 oz (67.9 kg)] 149 lb 11.2 oz (67.9 kg) (05/23 1055)  Intake/Output from previous day: 05/23 0701 - 05/24 0700 In: 2442.5 [P.O.:240; I.V.:1892.5; IV Piggyback:310] Out: 1302 [Urine:1002; Blood:300] Intake/Output this shift: No intake/output data recorded.  Recent Labs    08/19/17 0420  HGB 11.6*   Recent Labs    08/19/17 0420  WBC 11.5*  RBC 3.64*  HCT 34.1*  PLT 266   Recent Labs    08/19/17 0420  NA 139  K 3.8  CL 108  CO2 23  BUN 7  CREATININE 0.77  GLUCOSE 147*  CALCIUM 8.3*   No results for input(s): LABPT, INR in the last 72 hours.  Neurologically intact Neurovascular intact Sensation intact distally Intact pulses distally Dorsiflexion/Plantar flexion intact Incision: dressing C/D/I No cellulitis present Compartment soft    Assessment/Plan: 1 Day Post-Op Procedure(s) (LRB): RIGHT TOTAL HIP ARTHROPLASTY ANTERIOR APPROACH (Right) Advance diet Up with therapy D/C IV fluids Discharge home with home health after second session of PT today as long as she does well with mobilization WBAT RLE-no precautions    Lindsey Griffin Hem 08/19/2017, 7:58 AM

## 2017-08-19 NOTE — Care Management Note (Signed)
Case Management Note  Patient Details  Name: Lindsey Griffin MRN: [id number] Date of Birth: Jun 20, 1975  Subjective/Objective:    42 yr old female s/p right total hip arthroplasty.                Action/Plan: Case manager spoke with patient concerning discharge plan and DME. Patient was preoperatively setup with [facility], no changes. She has her own rollator  Patient says she will have family support at discharge.    Expected Discharge Date:  08/19/17               Expected Discharge Plan:  Home w Home Health Services  In-House Referral:  NA  Discharge planning Services  CM Consult  Post Acute Care Choice:  Home Health Choice offered to:  Patient  DME Arranged:  N/A(has Rollator) DME Agency:  NA  HH Arranged:  PT HH Agency:  [facility] ([facility]  Status of Service:  Completed, signed off  If discussed at [employer], dates discussed:    Additional Comments:  Durenda Guthrie, RN 08/19/2017, 12:45 PM

## 2017-08-19 NOTE — Progress Notes (Signed)
Written and verbal discharge instructions were provided to the patient.  The patient verbalizes understanding these instructions.

## 2017-08-23 ENCOUNTER — Telehealth (INDEPENDENT_AMBULATORY_CARE_PROVIDER_SITE_OTHER): Payer: Self-pay | Admitting: Orthopaedic Surgery

## 2017-08-23 ENCOUNTER — Other Ambulatory Visit (INDEPENDENT_AMBULATORY_CARE_PROVIDER_SITE_OTHER): Payer: Self-pay | Admitting: Physician Assistant

## 2017-08-23 ENCOUNTER — Other Ambulatory Visit (INDEPENDENT_AMBULATORY_CARE_PROVIDER_SITE_OTHER): Payer: Self-pay

## 2017-08-23 ENCOUNTER — Telehealth (INDEPENDENT_AMBULATORY_CARE_PROVIDER_SITE_OTHER): Payer: Self-pay

## 2017-08-23 MED ORDER — OXYCODONE-ACETAMINOPHEN 5-325 MG PO TABS: 1.0000 | ORAL_TABLET | ORAL | 0 refills | Status: DC | PRN

## 2017-08-23 MED ORDER — OXYCODONE-ACETAMINOPHEN 5-325 MG PO TABS
1.0000 | ORAL_TABLET | Freq: Two times a day (BID) | ORAL | 0 refills | Status: DC | PRN
Start: 2017-08-23 — End: 2017-08-23

## 2017-08-23 NOTE — Telephone Encounter (Signed)
Percocet 1-2 tab bid prn pain #30

## 2017-08-23 NOTE — Telephone Encounter (Signed)
Patient called stating that she has a red patch on the inner side of her right thigh that is hot and tight.  Stated that patch goes up her right leg to her pelvic area, but is not near her incision.  Patient had right hip surgery on 08/18/17. Using ice and taking Tylenol.  Stated that it may be from the straps that she is using to hold her ice packs in place.  CB# is [phone].  Please advise.  Thank You.

## 2017-08-23 NOTE — Telephone Encounter (Signed)
Called and sw Marcelino Duster to give verbal ok for PT orders as below.

## 2017-08-23 NOTE — Telephone Encounter (Signed)
Called pt to advise that rx is at the front desk for pick up.  

## 2017-08-23 NOTE — Telephone Encounter (Signed)
Patient called asking if she could get a refill on her pain medication. She is in a lot of pain and would like something to help. CB # [phone]

## 2017-08-23 NOTE — Telephone Encounter (Signed)
Marcelino Duster -(PT) with [facility] called left voicemail message advised she started HHPT on Sunday and need verbal orders for HHPT 3 WK 2 to improve movement in right hip. The number to contact Marcelino Duster is [phone]

## 2017-08-23 NOTE — Telephone Encounter (Signed)
Yeah most likely from straps

## 2017-08-23 NOTE — Telephone Encounter (Signed)
Pt is s/p a total hip replacement on 08/18/17 and states that she needs a refill on her pain medication. Please advise.

## 2017-08-23 NOTE — Telephone Encounter (Signed)
I'll bring you rx

## 2017-08-23 NOTE — Telephone Encounter (Signed)
Patient aware Rx ready at front desk  

## 2017-08-23 NOTE — Telephone Encounter (Signed)
Patient called wanting a refill   oxyCODONE (OXY IR/ROXICODONE) 5 MG   Stating in severe pain  Please call patient to advise.

## 2017-08-24 NOTE — Telephone Encounter (Signed)
Left full message on pt's vm.

## 2017-08-25 ENCOUNTER — Telehealth (INDEPENDENT_AMBULATORY_CARE_PROVIDER_SITE_OTHER): Payer: Self-pay | Admitting: Orthopaedic Surgery

## 2017-08-25 NOTE — Telephone Encounter (Signed)
handicap parking application mailed to patient

## 2017-08-29 ENCOUNTER — Telehealth (INDEPENDENT_AMBULATORY_CARE_PROVIDER_SITE_OTHER): Payer: Self-pay | Admitting: Orthopaedic Surgery

## 2017-08-29 ENCOUNTER — Other Ambulatory Visit (INDEPENDENT_AMBULATORY_CARE_PROVIDER_SITE_OTHER): Payer: Self-pay

## 2017-08-29 ENCOUNTER — Telehealth (INDEPENDENT_AMBULATORY_CARE_PROVIDER_SITE_OTHER): Payer: Self-pay

## 2017-08-29 MED ORDER — OXYCODONE-ACETAMINOPHEN 5-325 MG PO TABS: 1.0000 | ORAL_TABLET | Freq: Two times a day (BID) | ORAL | 0 refills | Status: DC | PRN

## 2017-08-29 NOTE — Telephone Encounter (Signed)
rx ready for pick up pat aware.

## 2017-08-29 NOTE — Telephone Encounter (Signed)
Jenness CornerSent Gil the message to see if he can sign Rx since Dr Roda ShuttersXu is in surgery all day today.

## 2017-08-29 NOTE — Telephone Encounter (Signed)
Dr. Roda ShuttersXu approved on Rx for Percocet #10. Can you sign Rx please since Dr Roda ShuttersXu is in SU all day today. S/P 08/18/17-RIGHT TOTAL HIP ARTHROPLASTY ANTERIOR APPROACH

## 2017-08-29 NOTE — Telephone Encounter (Signed)
Patient called saying she will run of out of her percocet today and would like a refill today if possible. If you could give her a call back at [phone]

## 2017-08-29 NOTE — Telephone Encounter (Signed)
Please advise 

## 2017-08-29 NOTE — Telephone Encounter (Signed)
OK WILL SIGN

## 2017-08-29 NOTE — Telephone Encounter (Signed)
Patient left message on triage phone stating she had right THA with Dr Roda ShuttersXu on 08/18/17.  She only has 6 percocet left but wanted to know if she should refill the percocet or try to drop down to Ultram/Tylenol for her post op pain. Please call her to advise [phone]

## 2017-08-29 NOTE — Telephone Encounter (Signed)
Try to drop down to ultram and tylenol if possible.  We can give her a refill of #10 of percocet.

## 2017-08-31 ENCOUNTER — Ambulatory Visit (INDEPENDENT_AMBULATORY_CARE_PROVIDER_SITE_OTHER): Payer: Medicare Other | Admitting: Orthopaedic Surgery

## 2017-08-31 ENCOUNTER — Encounter (INDEPENDENT_AMBULATORY_CARE_PROVIDER_SITE_OTHER): Payer: Self-pay | Admitting: Orthopaedic Surgery

## 2017-08-31 DIAGNOSIS — Z96641 Presence of right artificial hip joint: Secondary | ICD-10-CM

## 2017-08-31 NOTE — Progress Notes (Signed)
Post-Op Visit Note   Patient: Lindsey PiccoloJennifer L Griffin           Date of Birth: September 21, 1975           MRN: [id number] Visit Date: 08/31/2017 PCP: Kirstie PeriShah, Ashish, MD   Assessment & Plan:  Chief Complaint:  Chief Complaint  Patient presents with  . Right Hip - Pain, Routine Post Op    08/18/17 right total hip arthroplasty, anterior approach   Visit Diagnoses:  1. History of right hip replacement     Plan: Patient is a pleasant 42 year old female who presents to our clinic today 13 days status post right anterior total hip replacement, date of surgery 08/18/2017.  She has been doing excellent.  She has 1 more session left of home health PT.  She still has some pain, but this is relieved with narcotic pain medication twice daily.  No fevers, chills or any other systemic symptoms.  Examination of her right hip reveals a well-healing surgical incision without evidence of infection or cellulitis.  At this point, we will have the patient to continue with her home exercise program.  She will follow-up with us in 4 weeks time for repeat evaluation and x-ray.  Follow-Up Instructions: Return in about 1 month (around 09/28/2017).   Orders:  No orders of the defined types were placed in this encounter.  No orders of the defined types were placed in this encounter.   Imaging: No results found.  PMFS History: Patient Active Problem List   Diagnosis Date Noted  . History of hip replacement 08/18/2017  . Avascular necrosis of bone of right hip (HCC) 06/27/2017  . PTSD (post-traumatic stress disorder) 06/14/2014  . Restless legs syndrome (RLS) 12/03/2011  . Migraine without aura, with intractable migraine, so stated, without mention of status migrainosus 12/03/2011   Past Medical History:  Diagnosis Date  . Anxiety and depression   . Arthritis   . Avascular necrosis of hip (HCC)   . CHF (congestive heart failure) (HCC)    pt reports episode after birth of son in July 2017  . GERD (gastroesophageal  reflux disease)   . Hypertension   . Hypothyroidism   . Migraine headache   . Mild obesity   . Pre-eclampsia   . RLS (restless legs syndrome)     Family History  Problem Relation Age of Onset  . Headache Mother   . Hyperthyroidism Mother   . COPD Mother   . Hypertension Mother   . Uterine cancer Mother   . Alcohol abuse Mother   . Bipolar disorder Mother   . Hypertension Father   . Hypercholesterolemia Father   . Heart attack Father   . Asthma Father   . Alcohol abuse Father   . Migraines Sister   . Schizophrenia Sister   . Stroke Brother   . Alcohol abuse Brother   . Hypothyroidism Sister   . Hypertension Sister   . Hypertension Sister   . Fibroids Sister   . Bipolar disorder Sister   . Fibroids Sister     Past Surgical History:  Procedure Laterality Date  . BREAST LUMPECTOMY Right   . CHOLECYSTECTOMY    . DENTAL SURGERY    . GALLBLADDER SURGERY  2004  . TOTAL HIP ARTHROPLASTY Right 08/18/2017  . TOTAL HIP ARTHROPLASTY Right 08/18/2017   Procedure: RIGHT TOTAL HIP ARTHROPLASTY ANTERIOR APPROACH;  Surgeon: Tarry KosXu, Naiping M, MD;  Location: [facility] OR;  Service: Orthopedics;  Laterality: Right;   Social History  Occupational History  . Occupation: Unemployed  Tobacco Use  . Smoking status: Current Every Day Smoker    Packs/day: 0.50    Types: Cigarettes  . Smokeless tobacco: Never Used  Substance and Sexual Activity  . Alcohol use: Yes    Comment: Consumes 2 or 3 alcholic beverages per month   . Drug use: No  . Sexual activity: Not Currently

## 2017-09-08 ENCOUNTER — Telehealth (INDEPENDENT_AMBULATORY_CARE_PROVIDER_SITE_OTHER): Payer: Self-pay | Admitting: Orthopaedic Surgery

## 2017-09-08 NOTE — Telephone Encounter (Signed)
See message below  Please advise

## 2017-09-08 NOTE — Telephone Encounter (Signed)
Patient called to let Dr Roda ShuttersXu know that she has a bad cold and is pretty sick. Patient asked if she need an antibiotic due to having a cold and having had surgery on May 23rd. The number to contact patient is [phone]

## 2017-09-08 NOTE — Telephone Encounter (Signed)
Would recommend seeing her PCP or urgent care before starting abx.  It may just be viral which would self resolve

## 2017-09-09 NOTE — Telephone Encounter (Signed)
Called patient to advise  °

## 2017-10-07 ENCOUNTER — Ambulatory Visit (INDEPENDENT_AMBULATORY_CARE_PROVIDER_SITE_OTHER): Payer: Medicare Other

## 2017-10-07 ENCOUNTER — Ambulatory Visit (INDEPENDENT_AMBULATORY_CARE_PROVIDER_SITE_OTHER): Payer: Medicare Other | Admitting: Orthopaedic Surgery

## 2017-10-07 DIAGNOSIS — Z96641 Presence of right artificial hip joint: Secondary | ICD-10-CM

## 2017-10-07 NOTE — Progress Notes (Signed)
Post-Op Visit Note   Patient: Lindsey Griffin           Date of Birth: [birth year]           MRN: [id number] Visit Date: 10/07/2017 PCP: Kirstie Peri, MD   Assessment & Plan:  Chief Complaint:  Chief Complaint  Patient presents with  . Right Hip - Routine Post Op   Visit Diagnoses:  1. Status post right hip replacement     Plan: [name] is approximately 6 weeks status post right total hip.  She is doing well.  She has completed home physical therapy.  She does not take any pain medicines for her hip.  She is ambulating very well and very satisfied with her recovery.  Her surgical scar is fully healed.  Leg lengths are equal.  From my standpoint patient is doing very well.  She may steadily increase activity as tolerated.  Dental prophylaxis was reinforced.  Recheck in 6 weeks no x-rays needed.  Follow-Up Instructions: Return in about 6 weeks (around 11/18/2017).   Orders:  Orders Placed This Encounter  Procedures  . XR HIP UNILAT W OR W/O PELVIS 1V RIGHT   No orders of the defined types were placed in this encounter.   Imaging: Xr Hip Unilat W Or W/o Pelvis 1v Right  Result Date: 10/07/2017 Stable right total hip replacement   PMFS History: Patient Active Problem List   Diagnosis Date Noted  . History of hip replacement 08/18/2017  . Avascular necrosis of bone of right hip (HCC) 06/27/2017  . PTSD (post-traumatic stress disorder) 06/14/2014  . Restless legs syndrome (RLS) 12/03/2011  . Migraine without aura, with intractable migraine, so stated, without mention of status migrainosus 12/03/2011   Past Medical History:  Diagnosis Date  . Anxiety and depression   . Arthritis   . Avascular necrosis of hip (HCC)   . CHF (congestive heart failure) (HCC)    pt reports episode after birth of son in July 2017  . GERD (gastroesophageal reflux disease)   . Hypertension   . Hypothyroidism   . Migraine headache   . Mild obesity   . Pre-eclampsia   . RLS (restless legs  syndrome)     Family History  Problem Relation Age of Onset  . Headache Mother   . Hyperthyroidism Mother   . COPD Mother   . Hypertension Mother   . Uterine cancer Mother   . Alcohol abuse Mother   . Bipolar disorder Mother   . Hypertension Father   . Hypercholesterolemia Father   . Heart attack Father   . Asthma Father   . Alcohol abuse Father   . Migraines Sister   . Schizophrenia Sister   . Stroke Brother   . Alcohol abuse Brother   . Hypothyroidism Sister   . Hypertension Sister   . Hypertension Sister   . Fibroids Sister   . Bipolar disorder Sister   . Fibroids Sister     Past Surgical History:  Procedure Laterality Date  . BREAST LUMPECTOMY Right   . CHOLECYSTECTOMY    . DENTAL SURGERY    . GALLBLADDER SURGERY  2004  . TOTAL HIP ARTHROPLASTY Right 08/18/2017  . TOTAL HIP ARTHROPLASTY Right 08/18/2017   Procedure: RIGHT TOTAL HIP ARTHROPLASTY ANTERIOR APPROACH;  Surgeon: Tarry Kos, MD;  Location: [facility] OR;  Service: Orthopedics;  Laterality: Right;   Social History   Occupational History  . Occupation: Unemployed  Tobacco Use  . Smoking status: Current Every Day  Smoker    Packs/day: 0.50    Types: Cigarettes  . Smokeless tobacco: Never Used  Substance and Sexual Activity  . Alcohol use: Yes    Comment: Consumes 2 or 3 alcholic beverages per month   . Drug use: No  . Sexual activity: Not Currently

## 2017-11-18 ENCOUNTER — Ambulatory Visit (INDEPENDENT_AMBULATORY_CARE_PROVIDER_SITE_OTHER): Payer: Medicare Other | Admitting: Orthopaedic Surgery

## 2017-12-06 ENCOUNTER — Ambulatory Visit (INDEPENDENT_AMBULATORY_CARE_PROVIDER_SITE_OTHER): Payer: Medicare Other | Admitting: Orthopaedic Surgery

## 2018-02-03 ENCOUNTER — Ambulatory Visit (INDEPENDENT_AMBULATORY_CARE_PROVIDER_SITE_OTHER): Payer: Medicare Other | Admitting: Orthopaedic Surgery

## 2018-02-10 ENCOUNTER — Ambulatory Visit (INDEPENDENT_AMBULATORY_CARE_PROVIDER_SITE_OTHER): Payer: Medicare Other | Admitting: Orthopaedic Surgery

## 2018-03-01 ENCOUNTER — Ambulatory Visit (INDEPENDENT_AMBULATORY_CARE_PROVIDER_SITE_OTHER): Payer: Medicare Other | Admitting: Orthopaedic Surgery

## 2018-03-01 ENCOUNTER — Ambulatory Visit (INDEPENDENT_AMBULATORY_CARE_PROVIDER_SITE_OTHER): Payer: Self-pay

## 2018-03-01 ENCOUNTER — Encounter (INDEPENDENT_AMBULATORY_CARE_PROVIDER_SITE_OTHER): Payer: Self-pay | Admitting: Orthopaedic Surgery

## 2018-03-01 DIAGNOSIS — M25551 Pain in right hip: Secondary | ICD-10-CM

## 2018-03-01 DIAGNOSIS — M25552 Pain in left hip: Secondary | ICD-10-CM

## 2018-03-01 DIAGNOSIS — Z96641 Presence of right artificial hip joint: Secondary | ICD-10-CM | POA: Diagnosis not present

## 2018-03-01 NOTE — Progress Notes (Signed)
Office Visit Note   Patient: Lindsey Griffin           Date of Birth: 1975/06/21           MRN: [id number] Visit Date: 03/01/2018              Requested by: Kirstie PeriShah, Ashish, MD [address] [place], [place] [id number] PCP: Kirstie PeriShah, Ashish, MD   Assessment & Plan: Visit Diagnoses:  1. Pain of both hip joints   2. Status post right hip replacement     Plan: Impression is right hip external rotator dysfunction and stable left hip AVN.  I recommend relative rest and NSAIDs and physical therapy referral was made for the right hip.  In terms of her left hip she is doing fine and x-rays do not show any collapse.  We will just watch this on a regular basis.  We will recheck her in 6 months for her one year visit on her right hip replacement.  She will need to standing AP pelvis and lateral hip on return.  Follow-Up Instructions: Return in about 6 months (around 08/31/2018).   Orders:  Orders Placed This Encounter  Procedures  . XR HIP UNILAT W OR W/O PELVIS 2-3 VIEWS LEFT  . XR HIP UNILAT W OR W/O PELVIS 2-3 VIEWS RIGHT   No orders of the defined types were placed in this encounter.     Procedures: No procedures performed   Clinical Data: No additional findings.   Subjective: Chief Complaint  Patient presents with  . Right Hip - Pain  . Left Hip - Pain    Lindsey Griffin is 6 months status post right total hip replacement for avascular necrosis.  She states that for the last several weeks she has had some posterior lateral hip pain.  Denies any radicular symptoms.  The pain is worse when she is bending over.  She has been taking Tylenol.  She denies any groin pain.  She does have stable left hip and groin pain that radiates down the inner thigh to the knee.  She takes tramadol for migraines.  Denies any constitutional symptoms.   Review of Systems  Constitutional: Negative.   HENT: Negative.   Eyes: Negative.   Respiratory: Negative.   Cardiovascular: Negative.   Endocrine: Negative.     Musculoskeletal: Negative.   Neurological: Negative.   Hematological: Negative.   Psychiatric/Behavioral: Negative.   All other systems reviewed and are negative.    Objective: Vital Signs: There were no vitals taken for this visit.  Physical Exam  Constitutional: She is oriented to person, place, and time. She appears well-developed and well-nourished.  Pulmonary/Chest: Effort normal.  Neurological: She is alert and oriented to person, place, and time.  Skin: Skin is warm. Capillary refill takes less than 2 seconds.  Psychiatric: She has a normal mood and affect. Her behavior is normal. Judgment and thought content normal.  Nursing note and vitals reviewed.   Ortho Exam Right hip exam shows a fully healed surgical scar.  Trochanteric bursa is not overly tender.  She is more tender in the short external rotators and with hip internal rotation.  No pain with hip external rotation. Left hip exam is stable. Specialty Comments:  No specialty comments available.  Imaging: Xr Hip Unilat W Or W/o Pelvis 2-3 Views Left  Result Date: 03/01/2018 Stable avascular necrosis of left femoral head without any interval collapse.  Xr Hip Unilat W Or W/o Pelvis 2-3 Views Right  Result Date: 03/01/2018  Stable right total hip replacement in good alignment.  Without complication.    PMFS History: Patient Active Problem List   Diagnosis Date Noted  . History of hip replacement 08/18/2017  . Avascular necrosis of bone of right hip (HCC) 06/27/2017  . PTSD (post-traumatic stress disorder) 06/14/2014  . Restless legs syndrome (RLS) 12/03/2011  . Migraine without aura, with intractable migraine, so stated, without mention of status migrainosus 12/03/2011   Past Medical History:  Diagnosis Date  . Anxiety and depression   . Arthritis   . Avascular necrosis of hip (HCC)   . CHF (congestive heart failure) (HCC)    pt reports episode after birth of son in July 2017  . GERD (gastroesophageal  reflux disease)   . Hypertension   . Hypothyroidism   . Migraine headache   . Mild obesity   . Pre-eclampsia   . RLS (restless legs syndrome)     Family History  Problem Relation Age of Onset  . Headache Mother   . Hyperthyroidism Mother   . COPD Mother   . Hypertension Mother   . Uterine cancer Mother   . Alcohol abuse Mother   . Bipolar disorder Mother   . Hypertension Father   . Hypercholesterolemia Father   . Heart attack Father   . Asthma Father   . Alcohol abuse Father   . Migraines Sister   . Schizophrenia Sister   . Stroke Brother   . Alcohol abuse Brother   . Hypothyroidism Sister   . Hypertension Sister   . Hypertension Sister   . Fibroids Sister   . Bipolar disorder Sister   . Fibroids Sister     Past Surgical History:  Procedure Laterality Date  . BREAST LUMPECTOMY Right   . CHOLECYSTECTOMY    . DENTAL SURGERY    . GALLBLADDER SURGERY  2004  . TOTAL HIP ARTHROPLASTY Right 08/18/2017  . TOTAL HIP ARTHROPLASTY Right 08/18/2017   Procedure: RIGHT TOTAL HIP ARTHROPLASTY ANTERIOR APPROACH;  Surgeon: Tarry Kos, MD;  Location: [facility];  Service: Orthopedics;  Laterality: Right;   Social History   Occupational History  . Occupation: Unemployed  Tobacco Use  . Smoking status: Current Every Day Smoker    Packs/day: 0.50    Types: Cigarettes  . Smokeless tobacco: Never Used  Substance and Sexual Activity  . Alcohol use: Yes    Comment: Consumes 2 or 3 alcholic beverages per month   . Drug use: No  . Sexual activity: Not Currently

## 2018-06-29 ENCOUNTER — Ambulatory Visit: Payer: Medicare Other | Admitting: Rheumatology

## 2018-07-19 IMAGING — CR DG CHEST 2V
2 series · 2 of 2 positions shown · non-contrast
Comparison: None.

CLINICAL DATA: Preoperative study prior to hip arthroplasty.

EXAM:
CHEST - 2 VIEW

[w chest pa]
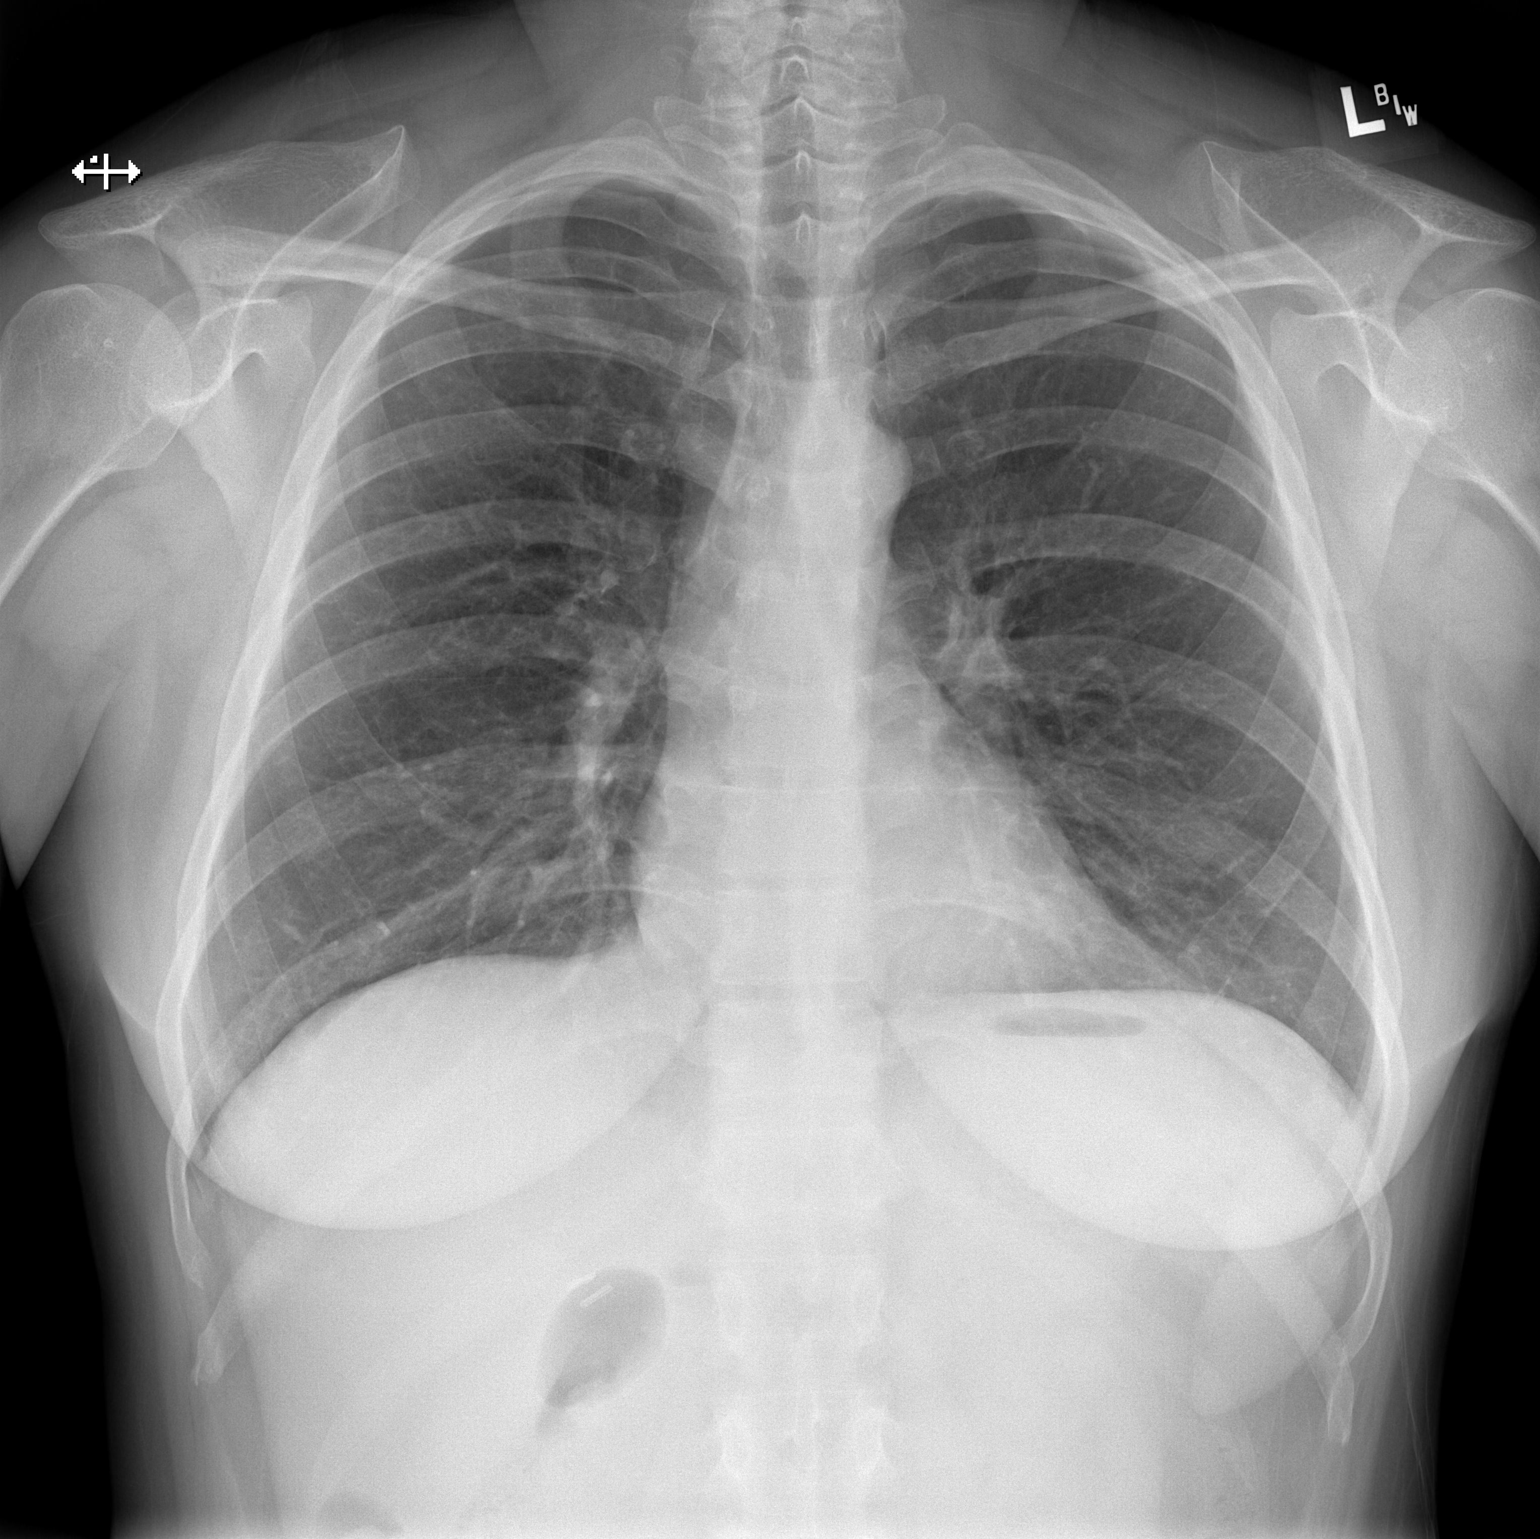

[w chest lat]
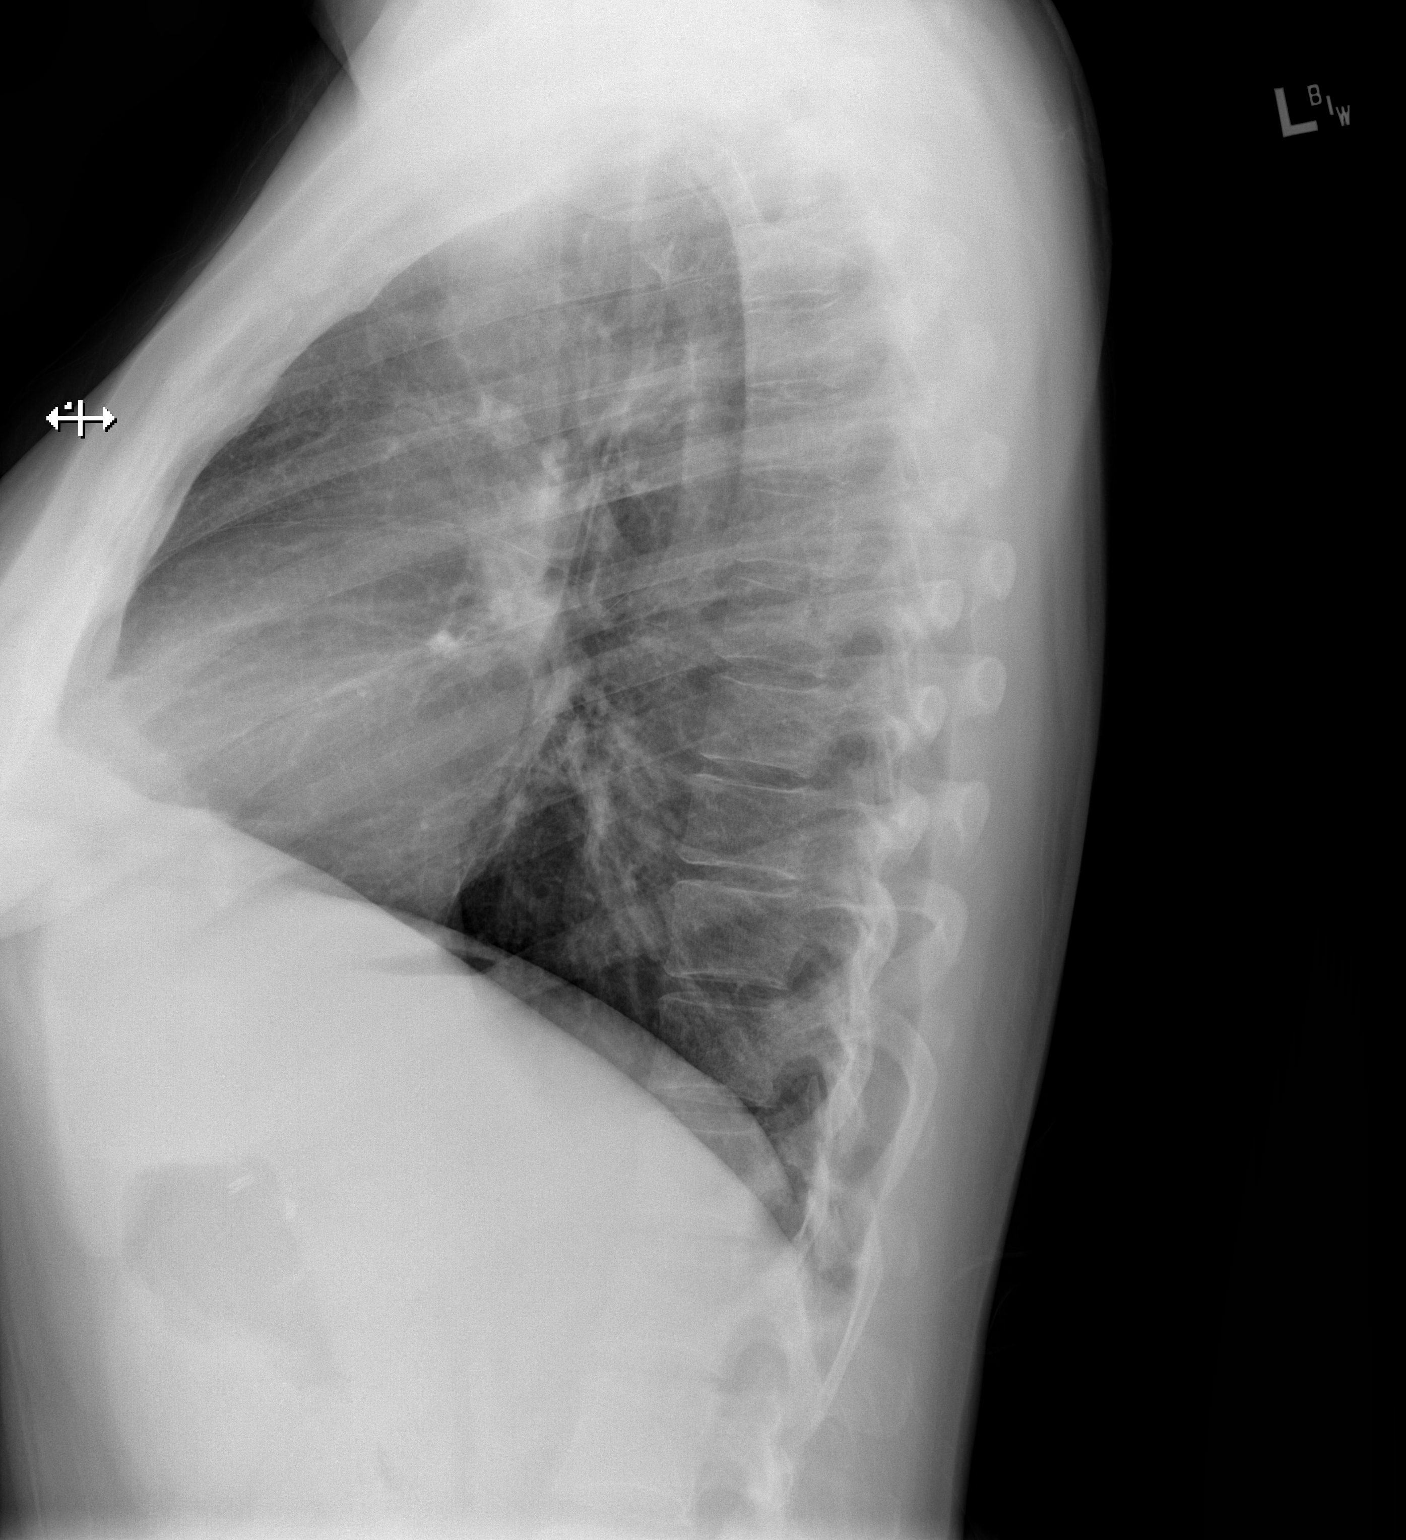

[2 of 2 positions shown; findings below may reference images not displayed]

FINDINGS: The heart size and mediastinal contours are within normal limits.
Both lungs are clear. The visualized skeletal structures are
unremarkable.
IMPRESSION: No active cardiopulmonary disease.

## 2018-08-30 ENCOUNTER — Ambulatory Visit: Payer: Self-pay | Admitting: Rheumatology

## 2018-10-13 ENCOUNTER — Ambulatory Visit: Payer: Medicare Other | Admitting: Rheumatology

## 2018-11-10 ENCOUNTER — Ambulatory Visit: Payer: Medicare Other | Admitting: Rheumatology

## 2018-11-17 ENCOUNTER — Ambulatory Visit: Payer: Medicare Other | Admitting: Orthopaedic Surgery

## 2018-12-25 NOTE — Progress Notes (Deleted)
Office Visit Note  Patient: Lindsey Griffin             Date of Birth: Feb 09, 1976           MRN: [id number]             PCP: Kirstie Peri, MD Referring: Kirstie Peri, MD Visit Date: 01/08/2019 Occupation: @GUAROCC @  Subjective:  No chief complaint on file.   History of Present Illness: Lindsey Griffin is a 43 y.o. female ***   Activities of Daily Living:  Patient reports morning stiffness for *** {minute/hour:19697}.   Patient {ACTIONS;DENIES/REPORTS:21021675::"Denies"} nocturnal pain.  Difficulty dressing/grooming: {ACTIONS;DENIES/REPORTS:21021675::"Denies"} Difficulty climbing stairs: {ACTIONS;DENIES/REPORTS:21021675::"Denies"} Difficulty getting out of chair: {ACTIONS;DENIES/REPORTS:21021675::"Denies"} Difficulty using hands for taps, buttons, cutlery, and/or writing: {ACTIONS;DENIES/REPORTS:21021675::"Denies"}  No Rheumatology ROS completed.   PMFS History:  Patient Active Problem List   Diagnosis Date Noted  . History of hip replacement 08/18/2017  . Avascular necrosis of bone of right hip (HCC) 06/27/2017  . PTSD (post-traumatic stress disorder) 06/14/2014  . Restless legs syndrome (RLS) 12/03/2011  . Migraine without aura, with intractable migraine, so stated, without mention of status migrainosus 12/03/2011    Past Medical History:  Diagnosis Date  . Anxiety and depression   . Arthritis   . Avascular necrosis of hip (HCC)   . CHF (congestive heart failure) (HCC)    pt reports episode after birth of son in July 2017  . GERD (gastroesophageal reflux disease)   . Hypertension   . Hypothyroidism   . Migraine headache   . Mild obesity   . Pre-eclampsia   . RLS (restless legs syndrome)     Family History  Problem Relation Age of Onset  . Headache Mother   . Hyperthyroidism Mother   . COPD Mother   . Hypertension Mother   . Uterine cancer Mother   . Alcohol abuse Mother   . Bipolar disorder Mother   . Hypertension Father   . Hypercholesterolemia Father    . Heart attack Father   . Asthma Father   . Alcohol abuse Father   . Migraines Sister   . Schizophrenia Sister   . Stroke Brother   . Alcohol abuse Brother   . Hypothyroidism Sister   . Hypertension Sister   . Hypertension Sister   . Fibroids Sister   . Bipolar disorder Sister   . Fibroids Sister    Past Surgical History:  Procedure Laterality Date  . BREAST LUMPECTOMY Right   . CHOLECYSTECTOMY    . DENTAL SURGERY    . GALLBLADDER SURGERY  2004  . TOTAL HIP ARTHROPLASTY Right 08/18/2017  . TOTAL HIP ARTHROPLASTY Right 08/18/2017   Procedure: RIGHT TOTAL HIP ARTHROPLASTY ANTERIOR APPROACH;  Surgeon: 08/20/2017, MD;  Location: [facility] OR;  Service: Orthopedics;  Laterality: Right;   Social History   Social History Narrative   Patient is married Lindsey Griffin)    Patient has a Dance movement psychotherapist.   Patient is disabled.   Patient has one child.   Patient is left-handed.   Patient does not drink any caffeine.             There is no immunization history on file for this patient.   Objective: Vital Signs: There were no vitals taken for this visit.   Physical Exam   Musculoskeletal Exam: ***  CDAI Exam: CDAI Score: - Patient Global: -; Provider Global: - Swollen: -; Tender: - Joint Exam   No joint exam has been documented for this visit  There is currently no information documented on the homunculus. Go to the Rheumatology activity and complete the homunculus joint exam.  Investigation: No additional findings.  Imaging: No results found.  Recent Labs: Lab Results  Component Value Date   WBC 11.5 (H) 08/19/2017   HGB 11.6 (L) 08/19/2017   PLT 266 08/19/2017   NA 139 08/19/2017   K 3.8 08/19/2017   CL 108 08/19/2017   CO2 23 08/19/2017   GLUCOSE 147 (H) 08/19/2017   BUN 7 08/19/2017   CREATININE 0.77 08/19/2017   BILITOT 0.7 08/05/2017   ALKPHOS 61 08/05/2017   AST 13 (L) 08/05/2017   ALT 11 (L) 08/05/2017   PROT 7.5 08/05/2017   ALBUMIN 4.2 08/05/2017    CALCIUM 8.3 (L) 08/19/2017   GFRAA >60 08/19/2017    Speciality Comments: No specialty comments available.  Procedures:  No procedures performed Allergies: Dust mite extract, Gabapentin, Peanuts [peanut oil], Pollen extract, and Topamax [topiramate]   Assessment / Plan:     Visit Diagnoses: Polyarthralgia - no rheum labs   Avascular necrosis of bone of right hip (HCC)  Trochanteric bursitis, right hip  Joint stiffness  RLS (restless legs syndrome)  History of hypothyroidism  Substance abuse (HCC)  PTSD (post-traumatic stress disorder)  Hx of migraines  Palpitations  Essential hypertension  History of anxiety  Hypercholesteremia  Orders: No orders of the defined types were placed in this encounter.  No orders of the defined types were placed in this encounter.   Face-to-face time spent with patient was *** minutes. Greater than 50% of time was spent in counseling and coordination of care.  Follow-Up Instructions: No follow-ups on file.   Ofilia Neas, PA-C  Note - This record has been created using Dragon software.  Chart creation errors have been sought, but may not always  have been located. Such creation errors do not reflect on  the standard of medical care.

## 2019-01-08 ENCOUNTER — Ambulatory Visit: Payer: Medicare Other | Admitting: Rheumatology

## 2019-02-05 NOTE — Progress Notes (Signed)
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  Subjective:  Pain in multiple joints and bones.   History of Present Illness: Lindsey Griffin is a 43 y.o. female seen in consultation per request of her PCP.  According to patient in 2019 she had right total hip replacement due to AVN.  She states she recalls since then she has been having pain and discomfort in multiple joints.  Her right total hip replacement continues to hurt.  She also has AVN in her left hip which hurts per patient.  She states she has most pain in her right hand and right wrist.  She also notices some swelling there.  Her right foot has been very painful.  She states she has nocturnal pain.  She states her joints and bones all ache.  She is also gained some weight and has fluid retention per patient.  She gives history of congestive heart failure in the past in 2017 when she was in the postpartum period.  Activities of Daily Living:  Patient reports morning stiffness for several hours.   Patient Reports nocturnal pain.  Difficulty dressing/grooming: Reports Difficulty climbing stairs: Reports Difficulty getting out of chair: Reports Difficulty using hands for taps, buttons, cutlery, and/or writing: Reports  Review of Systems  Constitutional: Positive for fatigue. Negative for night sweats, weight gain and weight loss.  HENT: Positive for mouth dryness. Negative for mouth sores, trouble swallowing, trouble swallowing and nose dryness.   Eyes: Negative for pain, redness, itching, visual disturbance and dryness.  Respiratory: Negative for cough, shortness of breath, wheezing and difficulty breathing.   Cardiovascular: Negative for chest pain, palpitations, hypertension, irregular  heartbeat and swelling in legs/feet.  Gastrointestinal: Negative for blood in stool, constipation and diarrhea.  Endocrine: Negative for increased urination.  Genitourinary: Positive for difficulty urinating. Negative for painful urination and vaginal dryness.  Musculoskeletal: Positive for arthralgias, joint pain, joint swelling and morning stiffness. Negative for myalgias, muscle weakness, muscle tenderness and myalgias.  Skin: Positive for rash. Negative for color change, hair loss, skin tightness, ulcers and sensitivity to sunlight.       eczema  Allergic/Immunologic: Negative for susceptible to infections.  Neurological: Positive for numbness, headaches, memory loss and weakness. Negative for dizziness and night sweats.  Hematological: Negative for bruising/bleeding tendency and swollen glands.  Psychiatric/Behavioral: Positive for sleep disturbance. Negative for depressed mood and confusion. The patient is not nervous/anxious.     PMFS History:  Patient Active Problem List   Diagnosis Date Noted  . History of hip replacement 08/18/2017  . Avascular necrosis of bone of right hip ([place]) 06/27/2017  . PTSD (post-traumatic stress disorder) 06/14/2014  . Restless legs syndrome (RLS) 12/03/2011  . Intractable migraine without aura 12/03/2011    Past Medical History:  Diagnosis Date  . Anxiety and depression   . Arthritis   . Avascular necrosis of hip ([place])   . CHF (congestive heart failure) ([place])    pt reports episode after birth of son in July 2017  . GERD (gastroesophageal reflux disease)   . Hypertension   . Hypothyroidism   . Migraine headache   .  Mild obesity   . Pre-eclampsia   . RLS (restless legs syndrome)     Family History  Problem Relation Age of Onset  . Headache Mother   . Hyperthyroidism Mother   . COPD Mother   . Hypertension Mother   . Uterine cancer Mother   . Alcohol abuse Mother   . Bipolar disorder Mother   . Hypertension Father   .  Hypercholesterolemia Father   . Heart attack Father   . Asthma Father   . Alcohol abuse Father   . Stroke Father   . Migraines Sister   . Schizophrenia Sister   . Hypothyroidism Sister   . Stroke Brother   . Alcohol abuse Brother   . Cirrhosis Brother   . Hypothyroidism Sister   . Hypothyroidism Sister   . Hypothyroidism Sister   . Healthy Daughter   . Healthy Son    Past Surgical History:  Procedure Laterality Date  . BREAST LUMPECTOMY Right   . CHOLECYSTECTOMY    . DENTAL SURGERY    . GALLBLADDER SURGERY  2004  . TOTAL HIP ARTHROPLASTY Right 08/18/2017  . TOTAL HIP ARTHROPLASTY Right 08/18/2017   Procedure: RIGHT TOTAL HIP ARTHROPLASTY ANTERIOR APPROACH;  Surgeon: Leandrew Koyanagi, MD;  Location: [facility];  Service: Orthopedics;  Laterality: Right;   Social History   Social History Narrative   Patient is married Banker)    Patient has a Financial risk analyst.   Patient is disabled.   Patient has one child.   Patient is left-handed.   Patient does not drink any caffeine.             There is no immunization history on file for this patient.   Objective: Vital Signs: BP 116/80 (BP Location: Right Arm, Patient Position: Sitting, Cuff Size: Normal)   Pulse 86   Resp 14   Ht 5' 6" (1.676 m)   Wt 191 lb (86.6 kg)   BMI 30.83 kg/m    Physical Exam Vitals signs and nursing note reviewed.  Constitutional:      Appearance: She is well-developed.  HENT:     Head: Normocephalic and atraumatic.  Eyes:     Conjunctiva/sclera: Conjunctivae normal.  Neck:     Musculoskeletal: Normal range of motion.  Cardiovascular:     Rate and Rhythm: Normal rate and regular rhythm.     Heart sounds: Normal heart sounds.  Pulmonary:     Effort: Pulmonary effort is normal.     Breath sounds: Normal breath sounds.  Abdominal:     General: Bowel sounds are normal.     Palpations: Abdomen is soft.  Lymphadenopathy:     Cervical: No cervical adenopathy.  Skin:    General: Skin is warm  and dry.     Capillary Refill: Capillary refill takes less than 2 seconds.  Neurological:     Mental Status: She is alert and oriented to person, place, and time.  Psychiatric:        Behavior: Behavior normal.      Musculoskeletal Exam: C-spine was in good range of motion.  Shoulder joints elbow joints wrist joints with good range of motion.  She had tenderness on palpation over bilateral MCPs and PIP joints.  No synovitis was noted.  Hip joints had painful range of motion.  Knee joints with good range of motion.  Ankle joints were in good range of motion.  She has some arthritic changes in the first MTP joint which was tender.  She also  had tenderness over her MTP joints but no synovitis was noted.  CDAI Exam: CDAI Score: - Patient Global: -; Provider Global: - Swollen: -; Tender: - Joint Exam   No joint exam has been documented for this visit   There is currently no information documented on the homunculus. Go to the Rheumatology activity and complete the homunculus joint exam.  Investigation: No additional findings.  Imaging: Xr Foot 2 Views Left  Result Date: 02/16/2019 First MTP valgus deformity was noted.  PIP and DIP narrowing was noted.  No MTP joint narrowing or erosive changes were noted.  No intertarsal or tibiotalar joint space narrowing was noted. 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  Xr Foot 2 Views Right  Result Date: 02/16/2019 First MTP narrowing and valgus deformity was noted.  PIP and DIP narrowing was noted.  No other MTP joint involvement was noted.  No intertarsal or tibiotalar joint space narrowing was noted. 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  Xr Hand 2 View Left  Result Date: 02/16/2019 Minimal CMC and PIP narrowing was noted.  No MCP, intercarpal radiocarpal joint space narrowing was noted.  No erosive changes were noted. Impression: These findings are consistent  with mild osteoarthritis of the hand.  Xr Hand 2 View Right  Result Date: 02/16/2019 Mild PIP and CMC joint space narrowing was noted.  No MCP, intercarpal or radiocarpal joint space narrowing was noted.  No juxta-articular osteopenia was noted.  No erosive changes were noted. Impression: These findings are consistent mild osteoarthritis of the hand.   Recent Labs: Lab Results  Component Value Date   WBC 11.5 (H) 08/19/2017   HGB 11.6 (L) 08/19/2017   PLT 266 08/19/2017   NA 139 08/19/2017   K 3.8 08/19/2017   CL 108 08/19/2017   CO2 23 08/19/2017   GLUCOSE 147 (H) 08/19/2017   BUN 7 08/19/2017   CREATININE 0.77 08/19/2017   BILITOT 0.7 08/05/2017   ALKPHOS 61 08/05/2017   AST 13 (L) 08/05/2017   ALT 11 (L) 08/05/2017   PROT 7.5 08/05/2017   ALBUMIN 4.2 08/05/2017   CALCIUM 8.3 (L) 08/19/2017   GFRAA >60 08/19/2017    Speciality Comments: No specialty comments available.  Procedures:  No procedures performed Allergies: Dust mite extract, Gabapentin, Peanuts [peanut oil], Pollen extract, and Topamax [topiramate]   Assessment / Plan:     Visit Diagnoses: Polyarthralgia-patient complains of pain and discomfort in multiple joints.  She also describes discomfort in her bones.  Pain in both hands -she complains of pain and swelling in her hands.  She had tenderness over MCPs and PIPs today.  No synovitis was noted.  I will obtain x-rays and labs today.  Plan: XR Hand 2 View Right, XR Hand 2 View Left, x-ray findings are consistent with mild osteoarthritis.  ESR, RF, CCP, 14-3-3 eta Protein, ANA, Uric acid, HLA-B27, ACE  Pain in both feet -she complains of pain going on in her bilateral feet.  She had tenderness over all MTPs.  No synovitis was noted.  Plan: XR Foot 2 Views Right, XR Foot 2 Views Left.  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  Avascular necrosis of bone of hip, unspecified laterality (HCC)-patient gives history of AVN in her bilateral hip  joints.  History of right hip replacement-her right hip is replaced which causes discomfort.  Other fatigue - Plan: CBC with diff, CMP with GFR, CK (Creatine Kinase), TSH, G6PD, Serum protein  electrophoresis with reflex  Restless legs syndrome (RLS)  Essential hypertension  Palpitations  Hypercholesteremia  History of hypothyroidism  Hx of migraines  PTSD (post-traumatic stress disorder)  Psychoactive substance abuse ([place])  Orders: Orders Placed This Encounter  Procedures  . XR Hand 2 View Right  . XR Hand 2 View Left  . XR Foot 2 Views Right  . XR Foot 2 Views Left  . CBC with diff  . CMP with GFR  . ESR  . CK (Creatine Kinase)  . TSH  . RF  . CCP  . 14-3-3 eta Protein  . ANA  . Uric acid  . HLA-B27  . ACE  . G6PD  . Serum protein electrophoresis with reflex   No orders of the defined types were placed in this encounter.   Face-to-face time spent with patient was 50 minutes. Greater than 50% of time was spent in counseling and coordination of care.  Follow-Up Instructions: Return for Polyarthralgia.   [name], MD  Note - This record has been created using Editor, commissioning.  Chart creation errors have been sought, but may not always  have been located. Such creation errors do not reflect on  the standard of medical care.

## 2019-02-13 ENCOUNTER — Ambulatory Visit: Payer: Medicare Other | Admitting: Rheumatology

## 2019-02-16 ENCOUNTER — Other Ambulatory Visit: Payer: Self-pay

## 2019-02-16 ENCOUNTER — Ambulatory Visit: Payer: Self-pay

## 2019-02-16 ENCOUNTER — Encounter: Payer: Self-pay | Admitting: Rheumatology

## 2019-02-16 ENCOUNTER — Ambulatory Visit: Payer: Medicare Other | Admitting: Rheumatology

## 2019-02-16 VITALS — BP 116/80 | HR 86 | Resp 14 | Ht 66.0 in | Wt 191.0 lb

## 2019-02-16 DIAGNOSIS — R5383 Other fatigue: Secondary | ICD-10-CM

## 2019-02-16 DIAGNOSIS — M2550 Pain in unspecified joint: Secondary | ICD-10-CM

## 2019-02-16 DIAGNOSIS — Z96641 Presence of right artificial hip joint: Secondary | ICD-10-CM

## 2019-02-16 DIAGNOSIS — Z8669 Personal history of other diseases of the nervous system and sense organs: Secondary | ICD-10-CM

## 2019-02-16 DIAGNOSIS — M79642 Pain in left hand: Secondary | ICD-10-CM

## 2019-02-16 DIAGNOSIS — M79641 Pain in right hand: Secondary | ICD-10-CM | POA: Diagnosis not present

## 2019-02-16 DIAGNOSIS — M79672 Pain in left foot: Secondary | ICD-10-CM

## 2019-02-16 DIAGNOSIS — E7800 Pure hypercholesterolemia, unspecified: Secondary | ICD-10-CM

## 2019-02-16 DIAGNOSIS — M87059 Idiopathic aseptic necrosis of unspecified femur: Secondary | ICD-10-CM

## 2019-02-16 DIAGNOSIS — R002 Palpitations: Secondary | ICD-10-CM

## 2019-02-16 DIAGNOSIS — M79671 Pain in right foot: Secondary | ICD-10-CM

## 2019-02-16 DIAGNOSIS — I10 Essential (primary) hypertension: Secondary | ICD-10-CM

## 2019-02-16 DIAGNOSIS — F4310 Post-traumatic stress disorder, unspecified: Secondary | ICD-10-CM

## 2019-02-16 DIAGNOSIS — Z8639 Personal history of other endocrine, nutritional and metabolic disease: Secondary | ICD-10-CM

## 2019-02-16 DIAGNOSIS — F1910 Other psychoactive substance abuse, uncomplicated: Secondary | ICD-10-CM

## 2019-02-16 DIAGNOSIS — G2581 Restless legs syndrome: Secondary | ICD-10-CM

## 2019-02-19 NOTE — Progress Notes (Signed)
I will discuss results at the follow-up visit.

## 2019-02-20 NOTE — Progress Notes (Signed)
TSH is elevated.  Please forward labs to her PCP.

## 2019-02-23 LAB — COMPLETE METABOLIC PANEL WITH GFR
AG Ratio: 1.9 (calc) (ref 1.0–2.5)
ALT: 10 U/L (ref 6–29)
AST: 17 U/L (ref 10–30)
Albumin: 4.3 g/dL (ref 3.6–5.1)
Alkaline phosphatase (APISO): 44 U/L (ref 31–125)
BUN: 9 mg/dL (ref 7–25)
CO2: 27 mmol/L (ref 20–32)
Calcium: 8.9 mg/dL (ref 8.6–10.2)
Chloride: 105 mmol/L (ref 98–110)
Creat: 0.69 mg/dL (ref 0.50–1.10)
GFR, Est African American: 124 mL/min/{1.73_m2} (ref >=60)
GFR, Est Non African American: 107 mL/min/{1.73_m2} (ref >=60)
Globulin: 2.3 g/dL (calc) (ref 1.9–3.7)
Glucose, Bld: 92 mg/dL (ref 65–99)
Potassium: 4.8 mmol/L (ref 3.5–5.3)
Sodium: 138 mmol/L (ref 135–146)
Total Bilirubin: 0.3 mg/dL (ref 0.2–1.2)
Total Protein: 6.6 g/dL (ref 6.1–8.1)

## 2019-02-23 LAB — ANGIOTENSIN CONVERTING ENZYME: Angiotensin-Converting Enzyme: 31 U/L (ref 9–67)

## 2019-02-23 LAB — SEDIMENTATION RATE: Sed Rate: 2 mm/h (ref 0–20)

## 2019-02-23 LAB — CBC WITH DIFFERENTIAL/PLATELET
Absolute Monocytes: 395 cells/uL (ref 200–950)
Basophils Absolute: 76 cells/uL (ref 0–200)
Basophils Relative: 1 %
Eosinophils Absolute: 540 cells/uL — ABNORMAL HIGH (ref 15–500)
Eosinophils Relative: 7.1 %
HCT: 39.6 % (ref 35.0–45.0)
Hemoglobin: 13.7 g/dL (ref 11.7–15.5)
Lymphs Abs: 2424 cells/uL (ref 850–3900)
MCH: 34.3 pg — ABNORMAL HIGH (ref 27.0–33.0)
MCHC: 34.6 g/dL (ref 32.0–36.0)
MCV: 99 fL (ref 80.0–100.0)
MPV: 8.8 fL (ref 7.5–12.5)
Monocytes Relative: 5.2 %
Neutro Abs: 4165 cells/uL (ref 1500–7800)
Neutrophils Relative %: 54.8 %
Platelets: 227 10*3/uL (ref 140–400)
RBC: 4 10*6/uL (ref 3.80–5.10)
RDW: 12.9 % (ref 11.0–15.0)
Total Lymphocyte: 31.9 %
WBC: 7.6 10*3/uL (ref 3.8–10.8)

## 2019-02-23 LAB — ANTI-NUCLEAR AB-TITER (ANA TITER): ANA Titer 1: 1:40 {titer} — ABNORMAL HIGH

## 2019-02-23 LAB — PROTEIN ELECTROPHORESIS, SERUM, WITH REFLEX
Albumin ELP: 4.2 g/dL (ref 3.8–4.8)
Alpha 1: 0.3 g/dL (ref 0.2–0.3)
Alpha 2: 0.7 g/dL (ref 0.5–0.9)
Beta 2: 0.3 g/dL (ref 0.2–0.5)
Beta Globulin: 0.4 g/dL (ref 0.4–0.6)
Gamma Globulin: 1 g/dL (ref 0.8–1.7)
Total Protein: 6.8 g/dL (ref 6.1–8.1)

## 2019-02-23 LAB — RHEUMATOID FACTOR: Rheumatoid fact SerPl-aCnc: <14 IU/mL (ref <14)

## 2019-02-23 LAB — CYCLIC CITRUL PEPTIDE ANTIBODY, IGG: Cyclic Citrullin Peptide Ab: <16 UNITS

## 2019-02-23 LAB — URIC ACID: Uric Acid, Serum: 4.5 mg/dL (ref 2.5–7.0)

## 2019-02-23 LAB — CK: Total CK: 65 U/L (ref 29–143)

## 2019-02-23 LAB — 14-3-3 ETA PROTEIN: 14-3-3 eta Protein: <0.2 ng/mL (ref <0.2)

## 2019-02-23 LAB — GLUCOSE 6 PHOSPHATE DEHYDROGENASE: G-6PDH: 15.9 U/g Hgb (ref 7.0–20.5)

## 2019-02-23 LAB — HLA-B27 ANTIGEN: HLA-B27 Antigen: NEGATIVE

## 2019-02-23 LAB — ANA: Anti Nuclear Antibody (ANA): POSITIVE — AB

## 2019-02-23 LAB — TSH: TSH: 7.19 mIU/L — ABNORMAL HIGH

## 2019-03-07 NOTE — Progress Notes (Signed)
Virtual Visit via Telephone Note  I connected with Lindsey Griffin on 03/09/19 at 11:15 AM EST by telephone and verified that I am speaking with the correct person using two identifiers.  Location: Patient: Home Provider: Clinic  This service was conducted via virtual visit.   The patient was located at home. I was located in my office.  Consent was obtained prior to the virtual visit and is aware of possible charges through their insurance for this visit.  The patient is an established patient.  Dr. Estanislado Pandy, MD conducted the virtual visit and [name], PA-C acted as scribe during the service.  Office staff helped with scheduling follow up visits after the service was conducted.   I discussed the limitations, risks, security and privacy concerns of performing an evaluation and management service by telephone and the availability of in person appointments. I also discussed with the patient that there may be a patient responsible charge related to this service. The patient expressed understanding and agreed to proceed.  CC: Discuss lab work History of Present Illness: Patient is a 43 year old female with a past medical history of osteoarthritis. She continues to have pain in both hands and both feet.  She had any joint swelling currently.  She has morning stiffness for several hours per day.  She states she has good days and bad days with arthralgias.    Review of Systems  Constitutional: Negative for fever and malaise/fatigue.  Eyes: Negative for photophobia, pain, discharge and redness.       +Dry eyes  Respiratory: Negative for cough, shortness of breath and wheezing.   Cardiovascular: Negative for chest pain and palpitations.  Gastrointestinal: Negative for blood in stool, constipation and diarrhea.  Genitourinary: Negative for dysuria.  Musculoskeletal: Positive for joint pain. Negative for back pain, myalgias and neck pain.       +Morning stiffness   Skin: Negative for rash.   Neurological: Positive for headaches. Negative for dizziness.  Psychiatric/Behavioral: Negative for depression. The patient is not nervous/anxious and does not have insomnia.       Observations/Objective: Physical Exam  Constitutional: She is oriented to person, place, and time.  Neurological: She is alert and oriented to person, place, and time.  Psychiatric: Mood, memory, affect and judgment normal.   Patient reports morning stiffness for   several hours.   Patient reports nocturnal pain.  Difficulty dressing/grooming: Denies Difficulty climbing stairs: Reports Difficulty getting out of chair: Reports Difficulty using hands for taps, buttons, cutlery, and/or writing: Reports   February 24, 2019 CBC normal, CMP normal, SPEP normal, G6PD normal, CK 65 ANA 1: 40, RF negative, anti-CCP negative, _0 eta negative, ACE 31, uric acid 4.5, HLA-B27 negative, ESR 2  Assessment and Plan: Diagnoses and all orders for this visit:  Primary osteoarthritis of both hands: Autoimmune work up negative.  Lab work from 02/24/19 was reviewed with the patient and all questions were addressed.  She has intermittent pain and stiffness in both hands.  No joint swelling.  Joint protection and muscle strengthening were discussed.  She was advised to notify us if she develops increased joint pain or joint swelling.  She will follow up as needed.   Primary osteoarthritis of both feet: She has intermittent pain in both feet.  No joint swelling.  We discussed the importance of wearing proper fitting shoes.  Comments: mild  Polyarthralgia: The importance of joint protection and muscle strengthening were discussed.  Comments: All autoimmune work-up was negative.  Avascular necrosis of bone of hip, unspecified laterality ([place])  History of right hip replacement: Doing well.   Other medical conditions are listed as follows:   Restless legs syndrome (RLS)  Essential  hypertension  Hypercholesteremia  History of hypothyroidism  Hx of migraines  PTSD (post-traumatic stress disorder)  Psychoactive substance abuse ([place])     Follow Up Instructions: She will follow up PRN    I discussed the assessment and treatment plan with the patient. The patient was provided an opportunity to ask questions and all were answered. The patient agreed with the plan and demonstrated an understanding of the instructions.   The patient was advised to call back or seek an in-person evaluation if the symptoms worsen or if the condition fails to improve as anticipated.  I provided 15 minutes of non-face-to-face time during this encounter.   [name], MD   Scribed by-  [name], PA-C

## 2019-03-09 ENCOUNTER — Telehealth (INDEPENDENT_AMBULATORY_CARE_PROVIDER_SITE_OTHER): Payer: Medicare Other | Admitting: Rheumatology

## 2019-03-09 ENCOUNTER — Encounter: Payer: Self-pay | Admitting: Rheumatology

## 2019-03-09 ENCOUNTER — Other Ambulatory Visit: Payer: Self-pay

## 2019-03-09 DIAGNOSIS — G2581 Restless legs syndrome: Secondary | ICD-10-CM

## 2019-03-09 DIAGNOSIS — Z8669 Personal history of other diseases of the nervous system and sense organs: Secondary | ICD-10-CM

## 2019-03-09 DIAGNOSIS — M19042 Primary osteoarthritis, left hand: Secondary | ICD-10-CM | POA: Diagnosis not present

## 2019-03-09 DIAGNOSIS — M19041 Primary osteoarthritis, right hand: Secondary | ICD-10-CM

## 2019-03-09 DIAGNOSIS — M87059 Idiopathic aseptic necrosis of unspecified femur: Secondary | ICD-10-CM

## 2019-03-09 DIAGNOSIS — M19072 Primary osteoarthritis, left ankle and foot: Secondary | ICD-10-CM

## 2019-03-09 DIAGNOSIS — F1910 Other psychoactive substance abuse, uncomplicated: Secondary | ICD-10-CM

## 2019-03-09 DIAGNOSIS — E7800 Pure hypercholesterolemia, unspecified: Secondary | ICD-10-CM

## 2019-03-09 DIAGNOSIS — Z8639 Personal history of other endocrine, nutritional and metabolic disease: Secondary | ICD-10-CM

## 2019-03-09 DIAGNOSIS — M2550 Pain in unspecified joint: Secondary | ICD-10-CM

## 2019-03-09 DIAGNOSIS — F4310 Post-traumatic stress disorder, unspecified: Secondary | ICD-10-CM

## 2019-03-09 DIAGNOSIS — I10 Essential (primary) hypertension: Secondary | ICD-10-CM

## 2019-03-09 DIAGNOSIS — M19071 Primary osteoarthritis, right ankle and foot: Secondary | ICD-10-CM

## 2019-03-09 DIAGNOSIS — Z96641 Presence of right artificial hip joint: Secondary | ICD-10-CM

## 2019-07-05 ENCOUNTER — Telehealth: Payer: Self-pay | Admitting: Orthopaedic Surgery

## 2019-07-05 NOTE — Telephone Encounter (Signed)
Called patient back x2. No answer. Not sure what note/form she needs. Could not leave VM. Mailbox not set up.

## 2019-07-05 NOTE — Telephone Encounter (Signed)
Pt called in requesting a call back once the note for her dentist is faxed over.   [phone]

## 2019-07-05 NOTE — Telephone Encounter (Signed)
Patient called.   She had hip surgery but has a dentist appointment later. They need preauthorization to proceed   Dentist fax number: [phone]  Patient call back: [phone]

## 2019-12-24 ENCOUNTER — Ambulatory Visit: Payer: Medicare Other | Admitting: Physician Assistant

## 2020-02-05 ENCOUNTER — Telehealth: Payer: Self-pay

## 2020-02-05 DIAGNOSIS — Z96641 Presence of right artificial hip joint: Secondary | ICD-10-CM

## 2020-02-05 DIAGNOSIS — M25551 Pain in right hip: Secondary | ICD-10-CM

## 2020-02-05 NOTE — Telephone Encounter (Signed)
yes

## 2020-02-05 NOTE — Telephone Encounter (Signed)
Patient called she is requesting a referral for pt  the office she wants to go to [facility]   [address] [place], [place] [address] Fax [phone] Call [phone]

## 2020-02-05 NOTE — Telephone Encounter (Signed)
Please advise 

## 2020-02-06 NOTE — Telephone Encounter (Signed)
Order placed in chart.

## 2020-02-06 NOTE — Addendum Note (Signed)
Addended by: Barbette Or on: 02/06/2020 08:17 AM   Modules accepted: Orders

## 2020-02-06 NOTE — Telephone Encounter (Signed)
Tried calling pt to inform but no answer and voice not set up

## 2020-02-13 ENCOUNTER — Telehealth: Payer: Self-pay

## 2020-02-13 NOTE — Telephone Encounter (Signed)
MRI of the left hip?

## 2020-02-13 NOTE — Telephone Encounter (Signed)
Patient called in wanting to get x rays or mri done on her hip before starting PT

## 2020-02-13 NOTE — Telephone Encounter (Signed)
Called and sw pt and she had concerns about doing physical therapy with her hip feeling weak. I advised the pt that she has not been in the office since 02/2018 and  should come in for eval. She is sch for 03/04/20 at 3pm

## 2020-02-13 NOTE — Telephone Encounter (Signed)
Please see below and advise.

## 2020-03-04 ENCOUNTER — Ambulatory Visit: Payer: Medicare Other | Admitting: Orthopaedic Surgery

## 2020-03-18 ENCOUNTER — Ambulatory Visit: Payer: Medicare Other | Admitting: Orthopaedic Surgery

## 2020-03-18 ENCOUNTER — Ambulatory Visit: Payer: Self-pay

## 2020-03-18 ENCOUNTER — Other Ambulatory Visit: Payer: Self-pay

## 2020-03-18 ENCOUNTER — Encounter: Payer: Self-pay | Admitting: Orthopaedic Surgery

## 2020-03-18 VITALS — Ht 66.5 in | Wt 201.0 lb

## 2020-03-18 DIAGNOSIS — Z96641 Presence of right artificial hip joint: Secondary | ICD-10-CM | POA: Diagnosis not present

## 2020-03-18 DIAGNOSIS — M87052 Idiopathic aseptic necrosis of left femur: Secondary | ICD-10-CM

## 2020-03-18 NOTE — Progress Notes (Signed)
Office Visit Note   Patient: Lindsey Griffin           Date of Birth: Aug 21, 1975           MRN: [id number] Visit Date: 03/18/2020              Requested by: Monico Blitz, MD [address] [place],  [place] [address] PCP: Monico Blitz, MD   Assessment & Plan: Visit Diagnoses:  1. History of total hip replacement, right   2. Avascular necrosis of bone of left hip (HCC)     Plan: Impression is left hip avascular necrosis and chronic right hip pain following total hip replacement.  At this point, made a another referral for physical therapy.  We will also obtain lab work to include CRP, sedimentation rate and CBC with differential.  We will call her with the results.  She will follow up with [place] in 1 years time for recheck of both hips.  Follow-Up Instructions: Return in about 1 year (around 03/18/2021).   Orders:  Orders Placed This Encounter  Procedures  . XR HIP UNILAT W OR W/O PELVIS 2-3 VIEWS LEFT  . XR HIP UNILAT W OR W/O PELVIS 2-3 VIEWS RIGHT  . C-reactive protein  . Sed Rate (ESR)  . CBC with Differential   No orders of the defined types were placed in this encounter.     Procedures: No procedures performed   Clinical Data: No additional findings.   Subjective: Chief Complaint  Patient presents with  . Right Hip - Pain  . Left Hip - Pain    HPI patient is a 44 year old female who comes in today about 2-1/2 years out right total hip replacement from AVN May of 2019.  She has had intermittent pain which is been more persistent over the past 1 year to 6 months.  She has seen [place] for this in the past where she is diagnosed with external rotator dysfunction.  She was referred to physical therapy but never went as her symptoms improved.  Her symptoms have returned.  These are located to the groin and posterior lateral hip.  She describes this as a constant ache and throb to her entire leg.  Her pain is worse when she is lifting her leg to go up and down stair.  She has taken  Tylenol and use CBD oil without relief of symptoms.  She has seen Dr. Andee Poles for the past for polyarthralgia where she has been worked up for autoimmune disease which was negative.  No fevers or chills.  Her left hip continues to be bothersome but not quite as bad as the right was prior to collapse.  Review of Systems as detailed in HPI.  All others reviewed and are negative.   Objective: Vital Signs: Ht 5' 6.5" (1.689 m)   Wt 201 lb (91.2 kg)   BMI 31.96 kg/m   Physical Exam well-developed well-nourished female no acute distress.  Alert oriented x3.  Ortho Exam left hip exam shows a minimally positive logroll and FADIR.  Right hip shows a positive logroll.  Moderate tenderness to the greater trochanter.  She has increased pain with hip flexion, abduction and abduction.  She is neurovascular intact distally.  Specialty Comments:  No specialty comments available.  Imaging: XR HIP UNILAT W OR W/O PELVIS 2-3 VIEWS LEFT  Result Date: 03/18/2020 Avascular necrosis of the femoral head with flattening  XR HIP UNILAT W OR W/O PELVIS 2-3 VIEWS RIGHT  Result Date: 03/18/2020  Well-seated prosthesis without complication    PMFS History: Patient Active Problem List   Diagnosis Date Noted  . History of hip replacement 08/18/2017  . Avascular necrosis of bone of right hip ([place]) 06/27/2017  . PTSD (post-traumatic stress disorder) 06/14/2014  . Restless legs syndrome (RLS) 12/03/2011  . Intractable migraine without aura 12/03/2011   Past Medical History:  Diagnosis Date  . Anxiety and depression   . Arthritis   . Avascular necrosis of hip ([place])   . CHF (congestive heart failure) ([place])    pt reports episode after birth of son in July 2017  . GERD (gastroesophageal reflux disease)   . Hypertension   . Hypothyroidism   . Migraine headache   . Mild obesity   . Pre-eclampsia   . RLS (restless legs syndrome)     Family History  Problem Relation Age of Onset  . Headache Mother   .  Hyperthyroidism Mother   . COPD Mother   . Hypertension Mother   . Uterine cancer Mother   . Alcohol abuse Mother   . Bipolar disorder Mother   . Hypertension Father   . Hypercholesterolemia Father   . Heart attack Father   . Asthma Father   . Alcohol abuse Father   . Stroke Father   . Migraines Sister   . Schizophrenia Sister   . Hypothyroidism Sister   . Stroke Brother   . Alcohol abuse Brother   . Cirrhosis Brother   . Hypothyroidism Sister   . Hypothyroidism Sister   . Hypothyroidism Sister   . Healthy Daughter   . Healthy Son     Past Surgical History:  Procedure Laterality Date  . BREAST LUMPECTOMY Right   . CHOLECYSTECTOMY    . DENTAL SURGERY    . GALLBLADDER SURGERY  2004  . TOTAL HIP ARTHROPLASTY Right 08/18/2017  . TOTAL HIP ARTHROPLASTY Right 08/18/2017   Procedure: RIGHT TOTAL HIP ARTHROPLASTY ANTERIOR APPROACH;  Surgeon: Leandrew Koyanagi, MD;  Location: [facility];  Service: Orthopedics;  Laterality: Right;   Social History   Occupational History  . Occupation: Unemployed  Tobacco Use  . Smoking status: Current Every Day Smoker    Packs/day: 0.50    Types: Cigarettes  . Smokeless tobacco: Never Used  Vaping Use  . Vaping Use: Never used  Substance and Sexual Activity  . Alcohol use: Yes    Comment: occ  . Drug use: No  . Sexual activity: Not Currently

## 2020-03-19 LAB — CBC WITH DIFFERENTIAL/PLATELET
Absolute Monocytes: 310 cells/uL (ref 200–950)
Basophils Absolute: 94 cells/uL (ref 0–200)
Basophils Relative: 1.3 %
Eosinophils Absolute: 382 cells/uL (ref 15–500)
Eosinophils Relative: 5.3 %
HCT: 42 % (ref 35.0–45.0)
Hemoglobin: 14.9 g/dL (ref 11.7–15.5)
Lymphs Abs: 2146 cells/uL (ref 850–3900)
MCH: 34 pg — ABNORMAL HIGH (ref 27.0–33.0)
MCHC: 35.5 g/dL (ref 32.0–36.0)
MCV: 95.9 fL (ref 80.0–100.0)
MPV: 8.7 fL (ref 7.5–12.5)
Monocytes Relative: 4.3 %
Neutro Abs: 4270 cells/uL (ref 1500–7800)
Neutrophils Relative %: 59.3 %
Platelets: 283 10*3/uL (ref 140–400)
RBC: 4.38 10*6/uL (ref 3.80–5.10)
RDW: 12.7 % (ref 11.0–15.0)
Total Lymphocyte: 29.8 %
WBC: 7.2 10*3/uL (ref 3.8–10.8)

## 2020-03-19 LAB — C-REACTIVE PROTEIN: CRP: 14.2 mg/L — ABNORMAL HIGH (ref <8.0)

## 2020-03-19 LAB — SEDIMENTATION RATE: Sed Rate: 6 mm/h (ref 0–20)

## 2020-03-20 ENCOUNTER — Telehealth: Payer: Self-pay | Admitting: Orthopaedic Surgery

## 2020-03-20 ENCOUNTER — Other Ambulatory Visit: Payer: Self-pay

## 2020-03-20 DIAGNOSIS — Z96641 Presence of right artificial hip joint: Secondary | ICD-10-CM

## 2020-03-20 NOTE — Telephone Encounter (Signed)
Sent to referral pool

## 2020-03-20 NOTE — Telephone Encounter (Signed)
I spoke to her about the labs already.

## 2020-03-20 NOTE — Telephone Encounter (Signed)
Yes please fax in.  Eval and treat right hip pain s/p right THA.  Strengthening.  Modalities prn.

## 2020-03-20 NOTE — Telephone Encounter (Signed)
What about the labs?  

## 2020-03-20 NOTE — Telephone Encounter (Signed)
Patient called. PT hand and specialist needs an updated PT referral. Also she would like someone to call her about her labs. [phone]

## 2020-03-25 ENCOUNTER — Telehealth: Payer: Self-pay | Admitting: Orthopaedic Surgery

## 2020-03-25 NOTE — Telephone Encounter (Signed)
noted 

## 2020-03-25 NOTE — Telephone Encounter (Signed)
Please advise 

## 2020-03-25 NOTE — Telephone Encounter (Signed)
Patient called requesting a call back. Patient states Dr. Roda Shutters called with her blood work results and she needs a call back to discuss again. Please call pt at [phone].

## 2020-03-25 NOTE — Telephone Encounter (Signed)
Spoke with patient.

## 2020-03-26 ENCOUNTER — Telehealth: Payer: Self-pay

## 2020-03-26 NOTE — Telephone Encounter (Signed)
Patient advised a Lupus anticoagulate test was not performed. Patient expressed understanding. Patient states she will be seeing her PCP. Patient will call back if she needs an appointment in our office.

## 2020-03-26 NOTE — Telephone Encounter (Signed)
Patient left a voicemail stating she had an appointment with Dr. Corliss Skains in November of 2020 and is checking if she was ever tested for Lupus.  Patient is requesting a return call.

## 2020-04-03 ENCOUNTER — Telehealth: Payer: Self-pay | Admitting: Orthopaedic Surgery

## 2020-04-03 ENCOUNTER — Encounter: Payer: Self-pay | Admitting: Orthopaedic Surgery

## 2020-04-03 NOTE — Telephone Encounter (Signed)
I called and sw pt she wants to have this mailed to her vs. Pick up can you put in mail please?

## 2020-04-03 NOTE — Telephone Encounter (Signed)
Ok to do 6 month temp

## 2020-04-03 NOTE — Telephone Encounter (Signed)
Pt would like to know if she could get a handicap packer. Please give her a call [phone]

## 2020-04-03 NOTE — Telephone Encounter (Signed)
Ready for pick up at the front desk. Tried calling pt to inform. No answer and no way to leave a voice mail

## 2020-04-03 NOTE — Telephone Encounter (Signed)
done

## 2020-04-03 NOTE — Telephone Encounter (Signed)
Pt called back missed the call. She was wondering if you mail it instead of her picking it up? She will stay close my her phone please call her

## 2020-04-07 ENCOUNTER — Encounter: Payer: Self-pay | Admitting: Orthopaedic Surgery

## 2020-04-07 ENCOUNTER — Telehealth: Payer: Self-pay

## 2020-04-07 NOTE — Telephone Encounter (Signed)
We have not seen Lindsey Griffin in the office since 2020.  At that time her sed rate was normal.  C-reactive protein is nonspecific.  If she is having joint swelling I would recommend scheduling a nonurgent appointment with [place].

## 2020-04-07 NOTE — Telephone Encounter (Signed)
Attempted to contact the patient and unable to leave a message, voicemail not set up.  

## 2020-04-07 NOTE — Telephone Encounter (Signed)
Patient called stating she had labwork with Dr. Roda Shutters and was told her CRP level was 14 which is elevated.  Patient states Dr. Roda Shutters told her she needed to contact Dr. Fatima Sanger office with questions regarding the results.

## 2020-04-08 NOTE — Telephone Encounter (Signed)
Patient advised we have not seen Lindsey Griffin in the office since 2020.  At that time her sed rate was normal.  C-reactive protein is nonspecific.  If she is having joint swelling I would recommend scheduling a nonurgent appointment with [place]. Patient has declined an appointment at this time.

## 2020-05-12 ENCOUNTER — Ambulatory Visit: Payer: Medicare Other | Admitting: Nutrition

## 2020-05-12 ENCOUNTER — Encounter: Payer: Medicare Other | Attending: Family Medicine | Admitting: Nutrition

## 2020-05-14 ENCOUNTER — Other Ambulatory Visit (HOSPITAL_COMMUNITY): Payer: Self-pay | Admitting: Radiology

## 2020-05-14 DIAGNOSIS — R0600 Dyspnea, unspecified: Secondary | ICD-10-CM

## 2020-05-21 ENCOUNTER — Other Ambulatory Visit: Payer: Self-pay

## 2020-05-21 ENCOUNTER — Encounter: Payer: Medicare Other | Attending: Family Medicine | Admitting: Nutrition

## 2020-05-21 VITALS — Ht 66.0 in | Wt 200.0 lb

## 2020-05-21 DIAGNOSIS — F4310 Post-traumatic stress disorder, unspecified: Secondary | ICD-10-CM | POA: Insufficient documentation

## 2020-05-21 DIAGNOSIS — E669 Obesity, unspecified: Secondary | ICD-10-CM

## 2020-05-21 DIAGNOSIS — M87051 Idiopathic aseptic necrosis of right femur: Secondary | ICD-10-CM | POA: Insufficient documentation

## 2020-05-21 DIAGNOSIS — F502 Bulimia nervosa: Secondary | ICD-10-CM

## 2020-05-21 NOTE — Patient Instructions (Addendum)
Goals  Keep food journal Eat meals on time Follow My Plate Be sure to eat breakfast Lose 1 lb per week Cut out processed and high fat foods.

## 2020-05-21 NOTE — Progress Notes (Signed)
Medical Nutrition Therapy:  Appt start time: 1430 end time:  1530. This visit was completed via telephone due to the COVID-19 pandemic.   I spoke with Lindsey Griffin and verified that I was speaking with the correct person with two patient identifiers (full name and date of birth).   I discussed the limitations related to this kind of visit and the patient is willing to proceed.   Assessment:  Primary concerns today: Overweight, HTN.  Wants to lose weight.   LIves with her husband and son.  Her husband does the shopping. Has migraines. Osteonecrosis. Limited mobility. Uses a cane or walker when needed. History of bulimia years ago. Still has some disordered/restricting food issues. Feels like she has gained 40 lbs in the last year since starting Amilopine. PH: COPD and sleep study to be done soon.,   Just started physical therapy.  Wt Readings from Last 3 Encounters:  05/21/20 200 lb (90.7 kg)  03/18/20 201 lb (91.2 kg)  02/16/19 191 lb (86.6 kg)   Ht Readings from Last 3 Encounters:  05/21/20 5\' 6"  (1.676 m)  03/18/20 5' 6.5" (1.689 m)  02/16/19 5\' 6"  (1.676 m)   Body mass index is 32.28 kg/m. @[facility]IFA @ Facility age limit for growth percentiles is 20 years. Facility age limit for growth percentiles is 20 years. CMP Latest Ref Rng & Units 02/16/2019 02/16/2019 08/19/2017  Glucose 65 - 99 mg/dL - 92 02/18/2019)  BUN 7 - 25 mg/dL - 9 7  Creatinine 02/18/2019 - 1.10 mg/dL - 08/21/2017 161(W  Sodium 9.60 - 146 mmol/L - 138 139  Potassium 3.5 - 5.3 mmol/L - 4.8 3.8  Chloride 98 - 110 mmol/L - 105 108  CO2 20 - 32 mmol/L - 27 23  Calcium 8.6 - 10.2 mg/dL - 8.9 4.54)  Total Protein 6.1 - 8.1 g/dL 6.8 6.6 -  Total Bilirubin 0.2 - 1.2 mg/dL - 0.3 -  Alkaline Phos 38 - 126 U/L - - -  AST 10 - 30 U/L - 17 -  ALT 6 - 29 U/L - 10 -    Preferred Learning Style:     No preference indicated   Learning Readiness:   Ready  Change in progress   MEDICATIONS: see list   DIETARY  INTAKE:  24-hr recall:  B ( AM):  Skips breakfast, coke or sprite Snk ( AM):  water L ( PM): 2 pm chips and salsa homemade, chocolate Snk ( PM): water D ( PM): Spaghetti 1 cup, 2 pieces of toast with garlic salt, water Snk ( PM): Beverages: 32-64 oz of water per day.  Usual physical activity: ADL  Estimated energy needs: 1400 calories 158 g carbohydrates 105 g protein 39 g fat  Progress Towards Goal(s):  In progress.   Nutritional Diagnosis:  NI-1.5 Excessive energy intake As related to Diet recall.  As evidenced by BMI.    Intervention:  Weight loss tips, meal planning, exercise, nutrient dense foods, avoiding empty calories, eating meals on time and measuring foods out for accuracy of potions, keeping a food journal.   Goals  Keep food journal Eat meals on time Follow My Plate Be sure to eat breakfast Lose 1 lb per week Cut out processed and high fat foods.  Teaching Method Utilized: Visual Auditory Hands on  Handouts given during visit include:  My Plate  Meal Plan Card  Weight loss tips    Barriers to learning/adherence to lifestyle change: none  Demonstrated degree of understanding via:  Teach Back  Monitoring/Evaluation:  Dietary intake, exercise, , and body weight in 1 month(s).

## 2020-06-12 ENCOUNTER — Encounter: Payer: Self-pay | Admitting: Nutrition

## 2020-06-13 ENCOUNTER — Other Ambulatory Visit (HOSPITAL_COMMUNITY): Payer: Medicare Other

## 2020-06-17 ENCOUNTER — Encounter (HOSPITAL_COMMUNITY): Payer: Medicare Other

## 2020-06-24 ENCOUNTER — Ambulatory Visit: Payer: Medicare Other | Admitting: Nutrition

## 2020-07-18 ENCOUNTER — Other Ambulatory Visit (HOSPITAL_COMMUNITY): Payer: Medicare Other

## 2020-07-22 ENCOUNTER — Encounter (HOSPITAL_COMMUNITY): Payer: Medicare Other

## 2020-08-12 ENCOUNTER — Ambulatory Visit (HOSPITAL_COMMUNITY): Payer: Medicare Other

## 2020-10-07 ENCOUNTER — Other Ambulatory Visit: Payer: Self-pay

## 2020-10-07 ENCOUNTER — Ambulatory Visit (HOSPITAL_COMMUNITY)
Admission: RE | Admit: 2020-10-07 | Discharge: 2020-10-07 | Disposition: A | Discharge status: Home / Self Care | Payer: Medicare Other | Source: Ambulatory Visit | Attending: Family Medicine | Admitting: Family Medicine

## 2020-10-07 DIAGNOSIS — R0600 Dyspnea, unspecified: Secondary | ICD-10-CM | POA: Insufficient documentation

## 2020-10-07 LAB — PULMONARY FUNCTION TEST
DL/VA % pred: 92 %
DL/VA: 4 ml/min/mmHg/L
DLCO unc % pred: 93 %
DLCO unc: 21.64 ml/min/mmHg
FEF 25-75 Post: 2.72 L/sec
FEF 25-75 Pre: 2.2 L/sec
FEF2575-%Change-Post: 23 %
FEF2575-%Pred-Post: 87 %
FEF2575-%Pred-Pre: 70 %
FEV1-%Change-Post: 6 %
FEV1-%Pred-Post: 93 %
FEV1-%Pred-Pre: 88 %
FEV1-Post: 2.94 L
FEV1-Pre: 2.75 L
FEV1FVC-%Change-Post: -1 %
FEV1FVC-%Pred-Pre: 92 %
FEV6-%Change-Post: 9 %
FEV6-%Pred-Post: 103 %
FEV6-%Pred-Pre: 94 %
FEV6-Post: 3.94 L
FEV6-Pre: 3.61 L
FEV6FVC-%Change-Post: 0 %
FEV6FVC-%Pred-Post: 101 %
FEV6FVC-%Pred-Pre: 101 %
FVC-%Change-Post: 8 %
FVC-%Pred-Post: 101 %
FVC-%Pred-Pre: 93 %
FVC-Post: 3.96 L
FVC-Pre: 3.64 L
Post FEV1/FVC ratio: 74 %
Post FEV6/FVC ratio: 100 %
Pre FEV1/FVC ratio: 76 %
Pre FEV6/FVC Ratio: 99 %
RV % pred: 123 %
RV: 2.19 L
TLC % pred: 111 %
TLC: 5.95 L

## 2020-10-07 MED ORDER — ALBUTEROL SULFATE (2.5 MG/3ML) 0.083% IN NEBU
2.5000 mg | INHALATION_SOLUTION | Freq: Once | RESPIRATORY_TRACT | Status: AC
  Administered 2020-10-07: 2.5 mg via RESPIRATORY_TRACT

## 2020-11-27 ENCOUNTER — Telehealth: Payer: Self-pay | Admitting: Orthopaedic Surgery

## 2020-11-27 NOTE — Telephone Encounter (Signed)
Pt called asking for new handicap placard. I informed pt she has been in offic since 12/21 and would need to be seen. 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 She is asking if she can still receive handicap placard. Please call pt about this matter at [phone].

## 2020-11-27 NOTE — Telephone Encounter (Signed)
Sure 5 years

## 2020-11-28 ENCOUNTER — Telehealth: Payer: Self-pay | Admitting: Orthopaedic Surgery

## 2020-11-28 NOTE — Telephone Encounter (Signed)
Lvm for pt to cb to discuss .... do to her living in [place] she may want it mailed.

## 2020-11-28 NOTE — Telephone Encounter (Signed)
Form completed and placed at the front desk 

## 2020-11-28 NOTE — Telephone Encounter (Signed)
Patient called. She would like the form for her handicap sign mailed to her.

## 2020-11-28 NOTE — Telephone Encounter (Signed)
Pt called back,. Wants it mailed. This was done today

## 2020-11-28 NOTE — Telephone Encounter (Signed)
Placed in the mail

## 2021-10-08 ENCOUNTER — Ambulatory Visit: Payer: Medicare Other | Admitting: Orthopaedic Surgery

## 2021-12-10 ENCOUNTER — Ambulatory Visit: Payer: Medicare Other | Admitting: Orthopaedic Surgery

## 2021-12-24 ENCOUNTER — Ambulatory Visit: Payer: Medicare Other | Admitting: Orthopaedic Surgery

## 2022-01-21 ENCOUNTER — Ambulatory Visit (INDEPENDENT_AMBULATORY_CARE_PROVIDER_SITE_OTHER): Payer: Medicare Other | Admitting: Orthopaedic Surgery

## 2022-01-21 ENCOUNTER — Encounter: Payer: Self-pay | Admitting: Orthopaedic Surgery

## 2022-01-21 VITALS — Ht 66.0 in | Wt 135.0 lb

## 2022-01-21 DIAGNOSIS — M87051 Idiopathic aseptic necrosis of right femur: Secondary | ICD-10-CM

## 2022-01-21 DIAGNOSIS — Z96641 Presence of right artificial hip joint: Secondary | ICD-10-CM | POA: Diagnosis not present

## 2022-01-22 NOTE — Progress Notes (Signed)
Office Visit Note   Patient: Lindsey Griffin           Date of Birth: 1976-03-06           MRN: [id number] Visit Date: 01/21/2022              Requested by: Wilburt Finlay, MD [place],  [place] [address] PCP: Wilburt Finlay, MD   Assessment & Plan: Visit Diagnoses: #1.  AVN left hip without collapse.  #2 previous right total of arthroplasty with persistent pain.  3.  Depression untreated.  Plan: We discussed patient needs to see her PCP or our [facility] and get started on some treatment for depression.  She has obvious depression anxiety with separation from her husband etc.  I reviewed x-rays with her.  She had lab work which did not exist suggest infection.  Plain radiographs look good.  No subsidence no loosening.  I do not think she needs a bone scan at this point and discussed with her that there is not unless her depression is treated she is not going to get any improvement in her pain problems.  Follow-Up Instructions: Return if symptoms worsen or fail to improve.   Orders:  No orders of the defined types were placed in this encounter.  No orders of the defined types were placed in this encounter.     Procedures: No procedures performed   Clinical Data: No additional findings.   Subjective: Chief Complaint  Patient presents with   Right Hip - Pain    HPI 46 year old female seen with right hip pain.  Right total hip arthroplasty by Dr.Xu 08/18/2017 with satisfactory postop serial x-rays.  She has been using heating pad at night taking Tylenol and ibuprofen.  States she is lost 80 pounds she is separated from her husband states that he was unfaithful.  She has a child with autism.  Problems with PTSD.  She has AVN of the left hip but has not had collapse and denies pain in her left hip currently.  She is not on any antidepressant medications is crying and is tearful.  Review of Systems positive history of restless legs migraines untreated  depression.   Objective: Vital Signs: Ht 5\' 6"  (1.676 m)   Wt 135 lb (61.2 kg)   BMI 21.79 kg/m   Physical Exam Constitutional:      Appearance: She is well-developed.  HENT:     Head: Normocephalic.     Right Ear: External ear normal.     Left Ear: External ear normal. There is no impacted cerumen.  Eyes:     Pupils: Pupils are equal, round, and reactive to light.  Neck:     Thyroid: No thyromegaly.     Trachea: No tracheal deviation.  Cardiovascular:     Rate and Rhythm: Normal rate.  Pulmonary:     Effort: Pulmonary effort is normal.  Abdominal:     Palpations: Abdomen is soft.  Musculoskeletal:     Cervical back: No rigidity.  Skin:    General: Skin is warm and dry.  Neurological:     Mental Status: She is alert and oriented to person, place, and time.  Psychiatric:        Behavior: Behavior normal.     Ortho Exam good range of motion right and left hips.  Pelvis is level no sciatic notch tenderness pulses are intact.  Specialty Comments:  No specialty comments available.  Imaging: No results  found.   PMFS History: Patient Active Problem List   Diagnosis Date Noted   S/P total hip arthroplasty 08/18/2017   Avascular necrosis of bone of right hip (HCC) 06/27/2017   PTSD (post-traumatic stress disorder) 06/14/2014   Restless legs syndrome (RLS) 12/03/2011   Intractable migraine without aura 12/03/2011   Past Medical History:  Diagnosis Date   Anxiety and depression    Arthritis    Avascular necrosis of hip (HCC)    CHF (congestive heart failure) (HCC)    pt reports episode after birth of son in July 2017   GERD (gastroesophageal reflux disease)    Hypertension    Hypothyroidism    Migraine headache    Mild obesity    Pre-eclampsia    RLS (restless legs syndrome)     Family History  Problem Relation Age of Onset   Headache Mother    Hyperthyroidism Mother    COPD Mother    Hypertension Mother    Uterine cancer Mother    Alcohol abuse  Mother    Bipolar disorder Mother    Hypertension Father    Hypercholesterolemia Father    Heart attack Father    Asthma Father    Alcohol abuse Father    Stroke Father    Migraines Sister    Schizophrenia Sister    Hypothyroidism Sister    Stroke Brother    Alcohol abuse Brother    Cirrhosis Brother    Hypothyroidism Sister    Hypothyroidism Sister    Hypothyroidism Sister    Healthy Daughter    Healthy Son     Past Surgical History:  Procedure Laterality Date   BREAST LUMPECTOMY Right    CHOLECYSTECTOMY     DENTAL SURGERY     GALLBLADDER SURGERY  2004   TOTAL HIP ARTHROPLASTY Right 08/18/2017   TOTAL HIP ARTHROPLASTY Right 08/18/2017   Procedure: RIGHT TOTAL HIP ARTHROPLASTY ANTERIOR APPROACH;  Surgeon: Tarry Kos, MD;  Location: [facility];  Service: Orthopedics;  Laterality: Right;   Social History   Occupational History   Occupation: Unemployed  Tobacco Use   Smoking status: Every Day    Packs/day: 0.50    Types: Cigarettes   Smokeless tobacco: Never  Vaping Use   Vaping Use: Never used  Substance and Sexual Activity   Alcohol use: Yes    Comment: occ   Drug use: No   Sexual activity: Not Currently

## 2022-01-25 ENCOUNTER — Telehealth: Payer: Self-pay | Admitting: Radiology

## 2022-01-25 DIAGNOSIS — F32A Depression, unspecified: Secondary | ICD-10-CM

## 2022-01-25 NOTE — Telephone Encounter (Signed)
Please see message from [facility] office below and advise.   Pt states Dr Lorin Mercy wanted her to see a psychiatrist but she has to be seen by her PCP first. They cannot see her until January. Will he refer her to [facility] in [place]? Phone [phone] fax [phone]  Pt can be reached at [phone]

## 2022-01-26 NOTE — Addendum Note (Signed)
Addended by: Meyer Cory on: 01/26/2022 12:59 PM   Modules accepted: Orders

## 2022-01-26 NOTE — Telephone Encounter (Signed)
noted 

## 2022-01-26 NOTE — Telephone Encounter (Signed)
I entered this referral, but forgot to include the fax and phone number.  Sorry.

## 2022-02-02 ENCOUNTER — Telehealth: Payer: Self-pay | Admitting: Orthopaedic Surgery

## 2022-02-02 ENCOUNTER — Telehealth: Payer: Self-pay

## 2022-02-02 NOTE — Telephone Encounter (Signed)
Lindsey Griffin at [facility] would like medication list faxed to [phone].  Stated that they did receive the referral.

## 2022-02-02 NOTE — Telephone Encounter (Signed)
[facility] called and advised they are unable to get in touch with patient. Phone has no voice mail.said calling restrictions.

## 2022-02-02 NOTE — Telephone Encounter (Signed)
I called patient at number last left in chart message. She is going to call [facility] to schedule.

## 2022-02-02 NOTE — Addendum Note (Signed)
Addended by: Meyer Cory on: 02/02/2022 11:33 AM   Modules accepted: Orders

## 2022-02-02 NOTE — Telephone Encounter (Signed)
Medication list faxed as patient had requested referral to facility and referral was entered. Spoke with patient earlier and she is going to call them for an appointment.

## 2022-07-15 ENCOUNTER — Ambulatory Visit: Payer: Medicare Other | Admitting: Orthopaedic Surgery

## 2022-10-21 ENCOUNTER — Ambulatory Visit (INDEPENDENT_AMBULATORY_CARE_PROVIDER_SITE_OTHER): Payer: Medicare Other | Admitting: Orthopaedic Surgery

## 2022-10-21 ENCOUNTER — Other Ambulatory Visit (INDEPENDENT_AMBULATORY_CARE_PROVIDER_SITE_OTHER): Payer: Medicare Other

## 2022-10-21 ENCOUNTER — Encounter: Payer: Self-pay | Admitting: Orthopaedic Surgery

## 2022-10-21 VITALS — Ht 66.0 in | Wt 160.0 lb

## 2022-10-21 DIAGNOSIS — M25552 Pain in left hip: Secondary | ICD-10-CM | POA: Diagnosis not present

## 2022-10-21 NOTE — Progress Notes (Signed)
Office Visit Note   Patient: Lindsey Griffin           Date of Birth: January 19, 1976           MRN: [id number] Visit Date: 10/21/2022              Requested by: Beatrix Fetters, MD [address] [place],  [place] [address] PCP: Beatrix Fetters, MD   Assessment & Plan: Visit Diagnoses:  1. 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  She is not symptomatic with hip range of motion.  She can gradually wean herself back to cane and then off the cane.  If she gets increased hip pain she can follow-up with Dr. Roda Shutters.   Follow-Up Instructions: No follow-ups on file.   Orders:  Orders Placed This Encounter  Procedures   XR HIP UNILAT W OR W/O PELVIS 2-3 VIEWS LEFT   No orders of the defined types were placed in this encounter.     Procedures: No procedures performed   Clinical Data: No additional findings.   Subjective: Chief Complaint  Patient presents with   Left Hip - Pain    HPI 47 year old female with a right total of arthroplasty from AVN with surgery by Dr.Xu done in 2019.  She states both hips have been bothering her.  She is on tramadol she gets 120 tablets with a refill and is on 80 MME currently.  She states she was position for mammograms and while this was being done she had increased pain in her left hip she states it was 10 out of 10.  She had to go back on her walker using a rollator she is use Tylenol and ibuprofen.  She denies fever or chills.  Review of Systems history of chronic migraines for which she takes tramadol.  Restless leg syndrome PTSD and previous right total of arthroplasty.  No associated bowel or bladder symptoms.   Objective: Vital Signs: Ht 5\' 6"  (1.676 m)   Wt 160 lb (72.6 kg)   BMI 25.82 kg/m   Physical Exam Constitutional:      Appearance: She is well-developed.  HENT:     Head: Normocephalic.     Right Ear: External ear normal.     Left Ear: External ear normal. There is no impacted  cerumen.  Eyes:     Pupils: Pupils are equal, round, and reactive to light.  Neck:     Thyroid: No thyromegaly.     Trachea: No tracheal deviation.  Cardiovascular:     Rate and Rhythm: Normal rate.  Pulmonary:     Effort: Pulmonary effort is normal.  Abdominal:     Palpations: Abdomen is soft.  Musculoskeletal:     Cervical back: No rigidity.  Skin:    General: Skin is warm and dry.  Neurological:     Mental Status: She is alert and oriented to person, place, and time.  Psychiatric:        Behavior: Behavior normal.     Ortho Exam negative logroll left hip no pain with sitting position internal rotation 30 degrees.  Well-healed right direct anterior hip incision.  Specialty Comments:  No specialty comments available.  Imaging: tanding AP pelvis frog-leg lateral right left hip demonstrates well-positioned right total of arthroplasty without loosening or subsidence.  Left hip AVN with slight collapse noted on lateral.  Sclerotic changes comparison to previous radiographs 03/18/2020.  Impression: Right total  hip arthroplasty left hip AVN without significant collapse.  Sclerotic changes noted in the head from AVN.   PMFS History: Patient Active Problem List   Diagnosis Date Noted   S/P total hip arthroplasty 08/18/2017   Avascular necrosis of bone of right hip (HCC) 06/27/2017   PTSD (post-traumatic stress disorder) 06/14/2014   Restless legs syndrome (RLS) 12/03/2011   Intractable migraine without aura 12/03/2011   Past Medical History:  Diagnosis Date   Anxiety and depression    Arthritis    Avascular necrosis of hip (HCC)    CHF (congestive heart failure) (HCC)    pt reports episode after birth of son in July 2017   GERD (gastroesophageal reflux disease)    Hypertension    Hypothyroidism    Migraine headache    Mild obesity    Pre-eclampsia    RLS (restless legs syndrome)     Family History  Problem Relation Age of Onset   Headache Mother     Hyperthyroidism Mother    COPD Mother    Hypertension Mother    Uterine cancer Mother    Alcohol abuse Mother    Bipolar disorder Mother    Hypertension Father    Hypercholesterolemia Father    Heart attack Father    Asthma Father    Alcohol abuse Father    Stroke Father    Migraines Sister    Schizophrenia Sister    Hypothyroidism Sister    Stroke Brother    Alcohol abuse Brother    Cirrhosis Brother    Hypothyroidism Sister    Hypothyroidism Sister    Hypothyroidism Sister    Healthy Daughter    Healthy Son     Past Surgical History:  Procedure Laterality Date   BREAST LUMPECTOMY Right    CHOLECYSTECTOMY     DENTAL SURGERY     GALLBLADDER SURGERY  2004   TOTAL HIP ARTHROPLASTY Right 08/18/2017   TOTAL HIP ARTHROPLASTY Right 08/18/2017   Procedure: RIGHT TOTAL HIP ARTHROPLASTY ANTERIOR APPROACH;  Surgeon: Tarry Kos, MD;  Location: [facility] OR;  Service: Orthopedics;  Laterality: Right;   Social History   Occupational History   Occupation: Unemployed  Tobacco Use   Smoking status: Every Day    Current packs/day: 0.50    Types: Cigarettes   Smokeless tobacco: Never  Vaping Use   Vaping status: Never Used  Substance and Sexual Activity   Alcohol use: Yes    Comment: occ   Drug use: No   Sexual activity: Not Currently

## 2022-10-29 ENCOUNTER — Telehealth: Payer: Self-pay | Admitting: Radiology

## 2022-10-29 NOTE — Telephone Encounter (Signed)
Noted. I sent message back to [facility] office to advise as well in case patient calls them back.

## 2022-10-29 NOTE — Telephone Encounter (Signed)
Please see message from [facility] office and advise.  She called and states that she wants a MRI done of her Right Hip because she knows that something isn't right.  She was abusive over the phone with her language.  Her number is [phone].

## 2023-01-04 ENCOUNTER — Ambulatory Visit: Payer: Medicare Other | Admitting: Orthopaedic Surgery

## 2023-01-25 ENCOUNTER — Ambulatory Visit (INDEPENDENT_AMBULATORY_CARE_PROVIDER_SITE_OTHER): Payer: Medicare Other | Admitting: Orthopaedic Surgery

## 2023-01-25 ENCOUNTER — Other Ambulatory Visit (INDEPENDENT_AMBULATORY_CARE_PROVIDER_SITE_OTHER): Payer: Medicare Other

## 2023-01-25 DIAGNOSIS — Z96641 Presence of right artificial hip joint: Secondary | ICD-10-CM

## 2023-01-25 DIAGNOSIS — M25551 Pain in right hip: Secondary | ICD-10-CM | POA: Insufficient documentation

## 2023-01-25 MED ORDER — LIDOCAINE HCL 1 % IJ SOLN
3.0000 mL | INTRAMUSCULAR | Status: AC | PRN
  Administered 2023-01-25: 3 mL

## 2023-01-25 MED ORDER — METHOCARBAMOL 500 MG PO TABS: 500.0000 mg | ORAL_TABLET | Freq: Four times a day (QID) | ORAL | 2 refills | Status: AC | PRN

## 2023-01-25 MED ORDER — BUPIVACAINE HCL 0.5 % IJ SOLN
3.0000 mL | INTRAMUSCULAR | Status: AC | PRN
Start: 2023-01-25 — End: 2023-01-25
  Administered 2023-01-25: 3 mL via INTRA_ARTICULAR

## 2023-01-25 MED ORDER — METHYLPREDNISOLONE ACETATE 40 MG/ML IJ SUSP
40.0000 mg | INTRAMUSCULAR | Status: AC | PRN
  Administered 2023-01-25: 40 mg via INTRA_ARTICULAR

## 2023-01-25 NOTE — Progress Notes (Signed)
Office Visit Note   Patient: Lindsey Griffin           Date of Birth: 09-18-1975           MRN: [id number] Visit Date: 01/25/2023              Requested by: Beatrix Fetters, MD [address] [place],  [place] [address] PCP: Beatrix Fetters, MD   Assessment & Plan: Visit Diagnoses:  1. Status post total replacement of right hip   2. Pain in right hip     Plan: Keta is a 47 year old female with chronic right hip and low back pain.  She has done physical therapy for the hip pain and is currently doing home exercises.  She would like to try a trochanteric injection today.  She is not interested in physical therapy for the back.  I have sent in prescription for Robaxin.  Will see her back as needed.  She continues to experience chronic low back and hip pain I recommend that she with plans for referral for  Total face to face encounter time was greater than 25 minutes and over half of this time was spent in counseling and/or coordination of care.   Follow-Up Instructions: No follow-ups on file.   Orders:  Orders Placed This Encounter  Procedures   Large Joint Inj   XR Lumbar Spine 2-3 Views   Meds ordered this encounter  Medications   methocarbamol (ROBAXIN) 500 MG tablet    Sig: Take 1 tablet (500 mg total) by mouth every 6 (six) hours as needed for muscle spasms.    Dispense:  30 tablet    Refill:  2      Procedures: Large Joint Inj: R greater trochanter on 01/25/2023 4:22 PM Indications: pain Details: 22 G needle  Arthrogram: No  Medications: 3 mL lidocaine 1 %; 3 mL bupivacaine 0.5 %; 40 mg methylPREDNISolone acetate 40 MG/ML Patient was prepped and draped in the usual sterile fashion.      Clinical Data: No additional findings.   Subjective: Chief Complaint  Patient presents with   Right Hip - Pain    HPI Lindsey Griffin is a 47 year old female who is status post right total hip replacement about 5 years ago for AVN.  She states that she is having lateral right  hip pain and low back pain since she was positioned for a mammogram.  Saw Dr. Ophelia Charter in July of this year for left hip pain.  She is reporting spasm in the lower back.  She has been using heating pad on her hip area since the July.  She has noticed some skin changes.  Review of Systems  Constitutional: Negative.   HENT: Negative.    Eyes: Negative.   Respiratory: Negative.    Cardiovascular: Negative.   Endocrine: Negative.   Musculoskeletal: Negative.   Neurological: Negative.   Hematological: Negative.   Psychiatric/Behavioral: Negative.    All other systems reviewed and are negative.    Objective: Vital Signs: There were no vitals taken for this visit.  Physical Exam Vitals and nursing note reviewed.  Constitutional:      Appearance: She is well-developed.  HENT:     Head: Normocephalic and atraumatic.  Pulmonary:     Effort: Pulmonary effort is normal.  Abdominal:     Palpations: Abdomen is soft.  Musculoskeletal:     Cervical back: Neck supple.  Skin:    General: Skin is warm.     Capillary Refill: Capillary  refill takes less than 2 seconds.  Neurological:     Mental Status: She is alert and oriented to person, place, and time.  Psychiatric:        Behavior: Behavior normal.        Thought Content: Thought content normal.        Judgment: Judgment normal.    Ortho Exam Exam of the right hip region shows patchy skin consistent with chronic heating pad use.  No signs of infection.  She has a fully healed surgical scar.  She has tenderness to the trochanteric region.  No sciatic tension signs.  Fluid range of motion of the hip without pain. Specialty Comments:  No specialty comments available.  Imaging: XR Lumbar Spine 2-3 Views  Result Date: 01/25/2023 X-rays of the lumbar spine showed no acute abnormalities or structural abnormalities.  Lumbar lordosis is preserved.    PMFS History: Patient Active Problem List   Diagnosis Date Noted   Pain in right hip  01/25/2023   S/P total hip arthroplasty 08/18/2017   Avascular necrosis of bone of right hip (HCC) 06/27/2017   PTSD (post-traumatic stress disorder) 06/14/2014   Restless legs syndrome (RLS) 12/03/2011   Intractable migraine without aura 12/03/2011   Past Medical History:  Diagnosis Date   Anxiety and depression    Arthritis    Avascular necrosis of hip (HCC)    CHF (congestive heart failure) (HCC)    pt reports episode after birth of son in July 2017   GERD (gastroesophageal reflux disease)    Hypertension    Hypothyroidism    Migraine headache    Mild obesity    Pre-eclampsia    RLS (restless legs syndrome)     Family History  Problem Relation Age of Onset   Headache Mother    Hyperthyroidism Mother    COPD Mother    Hypertension Mother    Uterine cancer Mother    Alcohol abuse Mother    Bipolar disorder Mother    Hypertension Father    Hypercholesterolemia Father    Heart attack Father    Asthma Father    Alcohol abuse Father    Stroke Father    Migraines Sister    Schizophrenia Sister    Hypothyroidism Sister    Stroke Brother    Alcohol abuse Brother    Cirrhosis Brother    Hypothyroidism Sister    Hypothyroidism Sister    Hypothyroidism Sister    Healthy Daughter    Healthy Son     Past Surgical History:  Procedure Laterality Date   BREAST LUMPECTOMY Right    CHOLECYSTECTOMY     DENTAL SURGERY     GALLBLADDER SURGERY  2004   TOTAL HIP ARTHROPLASTY Right 08/18/2017   TOTAL HIP ARTHROPLASTY Right 08/18/2017   Procedure: RIGHT TOTAL HIP ARTHROPLASTY ANTERIOR APPROACH;  Surgeon: Tarry Kos, MD;  Location: [facility] OR;  Service: Orthopedics;  Laterality: Right;   Social History   Occupational History   Occupation: Unemployed  Tobacco Use   Smoking status: Every Day    Current packs/day: 0.50    Types: Cigarettes   Smokeless tobacco: Never  Vaping Use   Vaping status: Never Used  Substance and Sexual Activity   Alcohol use: Yes    Comment: occ    Drug use: No   Sexual activity: Not Currently

## 2023-10-24 ENCOUNTER — Encounter: Payer: Self-pay | Admitting: *Deleted

## 2023-12-11 NOTE — Progress Notes (Unsigned)
 Lindsey Griffin Lindsey Griffin, female    DOB: April 29, 1975    MRN: [id number]   Brief patient profile:  48  yowf active smoker  from [place]  referred to pulmonary clinic in [place]  12/12/2023 by [facility]  -  for doe/noct wheeze at dx of  pre-eclampsia/ hbp and chf around age 48 > [facility] eval rec a prn saba    Pt not previously seen by PCCM service.    History of Present Illness  12/12/2023  Pulmonary/ 1st office eval/ Lindsey Griffin / [facility]  Chief Complaint  Patient presents with   Establish Care    Unable to breath for the interlock on vehicle  wheezing  Dyspnea:  couple of aisles slow/ pushing cart [employer]  Trouble playing with 47 y old at park wears her out  Cough: ok p 30 min spells each am > min mucoid production  Sleep: bed is flat/ 2 large bed  SABA use: couple times a day and every night around 3-4 AM since a year  02: none     No obvious day to day or daytime pattern/variability or assoc excess/ purulent sputum or mucus plugs or hemoptysis or cp or chest tightness,   or overt  hb symptoms.    Also denies any obvious fluctuation of symptoms with weather or environmental changes or other aggravating or alleviating factors except as outlined above   No unusual exposure hx or h/o childhood pna/ asthma or knowledge of premature birth.  Current Allergies, Complete Past Medical History, Past Surgical History, Family History, and Social History were reviewed in [employer] record.  ROS  The following are not active complaints unless bolded Hoarseness, sore throat, dysphagia, dental problems, itching, sneezing,  nasal congestion or discharge of excess mucus or purulent secretions, ear ache,   fever, chills, sweats, unintended wt loss or wt gain, classically pleuritic or exertional cp,  orthopnea pnd or arm/hand swelling  or leg swelling, presyncope, palpitations, abdominal pain, anorexia, nausea, vomiting, diarrhea  or change in bowel habits or change in bladder  habits, change in stools or change in urine, dysuria, hematuria,  rash, arthralgias, visual complaints, headache, numbness, weakness or ataxia or problems with walking or coordination,  change in mood or  memory.            Outpatient Medications Prior to Visit  Medication Sig Dispense Refill   amLODipine  (NORVASC ) 5 MG tablet Take 5 mg by mouth daily.     atorvastatin  (LIPITOR) 10 MG tablet Take 10 mg by mouth daily.     clonazePAM  (KLONOPIN ) 2 MG tablet Take 2 mg by mouth 4 (four) times daily.     Coenzyme Q10 (CO Q 10) 100 MG CAPS Take 100 mg by mouth daily.      Erenumab-aooe (AIMOVIG) 70 MG/ML SOAJ Inject 140 mg into the skin every 30 (thirty) days.     HYDROcodone-acetaminophen  (HYCET) 7.5-325 mg/15 ml solution Take 10 mLs by mouth 4 (four) times daily as needed for moderate pain (pain score 4-6).     labetalol  (NORMODYNE ) 100 MG tablet Take 100 mg by mouth every evening.     levothyroxine  (SYNTHROID , LEVOTHROID) 100 MCG tablet Take 100 mcg by mouth daily before breakfast. (Patient taking differently: Take 135 mcg by mouth daily before breakfast.)     losartan  (COZAAR ) 100 MG tablet Take 50 mg by mouth every evening.     nicotine  (NICODERM CQ  - DOSED IN MG/24 HR) 7 mg/24hr patch Apply 1 patch  daily  30 patch 0   omeprazole (PRILOSEC) 20 MG capsule Take 20 mg by mouth daily.     promethazine  (PHENERGAN ) 25 MG tablet Take 25 mg by mouth every 8 (eight) hours as needed for nausea or vomiting.      Rimegepant Sulfate (NURTEC PO) Take by mouth.     Semaglutide, 1 MG/DOSE, (OZEMPIC, 1 MG/DOSE,) 2 MG/1.5ML SOPN Inject into the skin.     traMADol (ULTRAM) 50 MG tablet Take 100 mg by mouth 4 (four) times daily.     triamcinolone cream (KENALOG) 0.1 % Apply 1 application topically daily as needed (eczema).     TURMERIC PO Take by mouth daily.     methocarbamol  (ROBAXIN ) 500 MG tablet Take 1 tablet (500 mg total) by mouth every 6 (six) hours as needed for muscle spasms. (Patient not taking:  Reported on 12/12/2023) 30 tablet 2   ranitidine (ZANTAC) 75 MG tablet Take 75 mg by mouth daily. (Patient not taking: Reported on 12/12/2023)     Facility-Administered Medications Prior to Visit  Medication Dose Route Frequency Provider Last Rate Last Admin   methylPREDNISolone  sodium succinate (SOLU-MEDROL ) 250 mg in sodium chloride  0.9 % 100 mL IVPB  250 mg Intravenous Continuous Jenel Carlin POUR, MD   250 mg at 02/08/13 1200    Past Medical History:  Diagnosis Date   Anxiety and depression    Arthritis    Avascular necrosis of hip (HCC)    CHF (congestive heart failure) (HCC)    pt reports episode after birth of son in July 2017   GERD (gastroesophageal reflux disease)    Hypertension    Hypothyroidism    Migraine headache    Mild obesity    Pre-eclampsia    RLS (restless legs syndrome)       Objective:     BP 117/76   Pulse 76   Ht 5' 6 (1.676 m)   Wt 186 lb 6.4 oz (84.6 kg)   LMP 12/10/2023   SpO2 96% Comment: ra  BMI 30.09 kg/m   SpO2: 96 % (ra) amb wf/ no congestion on voluntary cough    HEENT : Oropharynx  clear      Nasal turbinates nl   NECK :  without  apparent JVD/ palpable Nodes/TM    LUNGS: no acc muscle use,  Nl contour chest  with min exp rhonchi bilaterally    CV:  RRR  no s3 or murmur or increase in P2, and no edema   ABD:  soft and nontender   MS:  Gait nl   ext warm without deformities Or obvious joint restrictions  calf tenderness, cyanosis or clubbing    SKIN: warm and dry without lesions    NEURO:  alert, approp, nl sensorium with  no motor or cerebellar deficits apparent.       Assessment   Assessment & Plan Asthmatic bronchitis , chronic (HCC) Active smoker - 12/12/2023  After extensive coaching inhaler device,  effectiveness =    75% (short ti)  > trial of symbicort  80 2bid  - EOS/ Alpha one AT phenotype 12/12/2023   Return p pfts complete          Each maintenance medication was reviewed in detail including  emphasizing most importantly the difference between maintenance and prns and under what circumstances the prns are to be triggered using an action plan format where appropriate.  Total time for H and P, chart review, counseling, reviewing hfa device(s) and generating customized AVS unique to  this office visit / same day charting = 45 min new pt eval         Cigarette smoker 4  min discussion re active cigarette smoking in addition to office E&M  Ask about tobacco use:   ongoing  Advise quitting   I took an extended  opportunity with this patient to outline the consequences of continued cigarette use  in airway disorders based on all the data we have from the multiple national lung health studies (perfomed over decades at millions of dollars in cost)  indicating that smoking cessation, not choice of inhalers or pulmonary physicians, is the most important aspect of her care and would likely result in resolution of AM cough (c/w CB) within 6 weeks of stopping smoking  Assess willingness:  Not committed at this point Assist in quit attempt:  Per PCP when ready Arrange follow up:   Follow up per Primary Care planned         AVS  Patient Instructions  Symbicort  80 Take 2 puffs first thing in am and then another 2 puffs about 12 hours later.   Work on inhaler technique:  relax and gently blow all the way out then take a nice smooth full deep breath back in, triggering the inhaler at same time you start breathing in.  Hold breath in for at least  5 seconds if you can. Blow out symbicort   thru nose. Rinse and gargle with water when done.  If mouth or throat bother you at all,  try brushing teeth/gums/tongue with arm and hammer toothpaste/ make a slurry and gargle and spit out.      Use your albuterol  as a rescue medication to be used if you can't catch your breath by resting, slowing your pace,  or doing a relaxed purse lip breathing pattern.  - The less you use it, the better it will work when you  need it. - Ok to use up to 2 puffs  every 4 hours if you must but call for  appointment if use goes up over your usual need - Don't leave home without it !!  (think of it like a spare tire or starter fluid for your car)   PFTs here and in [place] Asap (2 sets spirometry only)   The key is to stop smoking completely before smoking completely stops you!  Please remember to go to the lab department   for your tests - we will call you with the results when they are available.      Please schedule a follow up visit in 3 months but call sooner if needed    Ozell America, MD 12/13/2023

## 2023-12-12 ENCOUNTER — Ambulatory Visit: Admitting: Internal Medicine

## 2023-12-12 ENCOUNTER — Encounter: Payer: Self-pay | Admitting: Internal Medicine

## 2023-12-12 VITALS — BP 117/76 | HR 76 | Ht 66.0 in | Wt 186.4 lb

## 2023-12-12 DIAGNOSIS — J4489 Other specified chronic obstructive pulmonary disease: Secondary | ICD-10-CM

## 2023-12-12 DIAGNOSIS — F17210 Nicotine dependence, cigarettes, uncomplicated: Secondary | ICD-10-CM

## 2023-12-12 MED ORDER — BUDESONIDE-FORMOTEROL FUMARATE 80-4.5 MCG/ACT IN AERO: INHALATION_SPRAY | RESPIRATORY_TRACT | 12 refills | Status: AC

## 2023-12-12 NOTE — Patient Instructions (Addendum)
 Symbicort  80 Take 2 puffs first thing in am and then another 2 puffs about 12 hours later.   Work on inhaler technique:  relax and gently blow all the way out then take a nice smooth full deep breath back in, triggering the inhaler at same time you start breathing in.  Hold breath in for at least  5 seconds if you can. Blow out symbicort   thru nose. Rinse and gargle with water when done.  If mouth or throat bother you at all,  try brushing teeth/gums/tongue with arm and hammer toothpaste/ make a slurry and gargle and spit out.      Use your albuterol  as a rescue medication to be used if you can't catch your breath by resting, slowing your pace,  or doing a relaxed purse lip breathing pattern.  - The less you use it, the better it will work when you need it. - Ok to use up to 2 puffs  every 4 hours if you must but call for  appointment if use goes up over your usual need - Don't leave home without it !!  (think of it like a spare tire or starter fluid for your car)   PFTs here and in [place] Asap (2 sets spirometry only)   The key is to stop smoking completely before smoking completely stops you!  Please remember to go to the lab department   for your tests - we will call you with the results when they are available.      Please schedule a follow up visit in 3 months but call sooner if needed

## 2023-12-13 DIAGNOSIS — F17210 Nicotine dependence, cigarettes, uncomplicated: Secondary | ICD-10-CM | POA: Insufficient documentation

## 2023-12-13 NOTE — Assessment & Plan Note (Addendum)
 4  min discussion re active cigarette smoking in addition to office E&M  Ask about tobacco use:   ongoing  Advise quitting   I took an extended  opportunity with this patient to outline the consequences of continued cigarette use  in airway disorders based on all the data we have from the multiple national lung health studies (perfomed over decades at millions of dollars in cost)  indicating that smoking cessation, not choice of inhalers or pulmonary physicians, is the most important aspect of her care and would likely result in resolution of AM cough (c/w CB) within 6 weeks of stopping smoking  Assess willingness:  Not committed at this point Assist in quit attempt:  Per PCP when ready Arrange follow up:   Follow up per Primary Care planned

## 2023-12-13 NOTE — Assessment & Plan Note (Addendum)
 Active smoker - 12/12/2023  After extensive coaching inhaler device,  effectiveness =    75% (short ti)  > trial of symbicort  80 2bid  - EOS/ Alpha one AT phenotype 12/12/2023   Return p pfts complete          Each maintenance medication was reviewed in detail including emphasizing most importantly the difference between maintenance and prns and under what circumstances the prns are to be triggered using an action plan format where appropriate.  Total time for H and P, chart review, counseling, reviewing hfa device(s) and generating customized AVS unique to this office visit / same day charting = 45 min new pt eval

## 2023-12-14 LAB — CBC WITH DIFFERENTIAL/PLATELET
Basophils Absolute: 0.1 x10E3/uL (ref 0.0–0.2)
Basos: 1 %
EOS (ABSOLUTE): 0.9 x10E3/uL — ABNORMAL HIGH (ref 0.0–0.4)
Eos: 10 %
Hematocrit: 44.9 % (ref 34.0–46.6)
Hemoglobin: 14.8 g/dL (ref 11.1–15.9)
Immature Grans (Abs): 0 x10E3/uL (ref 0.0–0.1)
Immature Granulocytes: 0 %
Lymphocytes Absolute: 2.5 x10E3/uL (ref 0.7–3.1)
Lymphs: 29 %
MCH: 31.4 pg (ref 26.6–33.0)
MCHC: 33 g/dL (ref 31.5–35.7)
MCV: 95 fL (ref 79–97)
Monocytes Absolute: 0.4 x10E3/uL (ref 0.1–0.9)
Monocytes: 5 %
Neutrophils Absolute: 4.9 x10E3/uL (ref 1.4–7.0)
Neutrophils: 55 %
Platelets: 238 x10E3/uL (ref 150–450)
RBC: 4.71 x10E6/uL (ref 3.77–5.28)
RDW: 13.7 % (ref 11.7–15.4)
WBC: 8.9 x10E3/uL (ref 3.4–10.8)

## 2023-12-14 LAB — ALPHA-1-ANTITRYPSIN PHENOTYP: A-1 Antitrypsin: 167 mg/dL (ref 101–187)

## 2023-12-15 ENCOUNTER — Ambulatory Visit: Payer: Self-pay | Admitting: Internal Medicine

## 2023-12-15 NOTE — Progress Notes (Signed)
 Called and spoke with pt regarding her lab results, pt confirmed understanding nfn

## 2024-01-05 ENCOUNTER — Ambulatory Visit (HOSPITAL_COMMUNITY)
Admission: RE | Admit: 2024-01-05 | Discharge: 2024-01-05 | Disposition: A | Discharge status: Home / Self Care | Source: Ambulatory Visit | Attending: Internal Medicine | Admitting: Internal Medicine

## 2024-01-05 DIAGNOSIS — J4489 Other specified chronic obstructive pulmonary disease: Secondary | ICD-10-CM | POA: Insufficient documentation

## 2024-01-05 LAB — PULMONARY FUNCTION TEST
FEF 25-75 Post: 1.95 L/s
FEF 25-75 Pre: 1.68 L/s
FEF2575-%Change-Post: 16 %
FEF2575-%Pred-Post: 66 %
FEF2575-%Pred-Pre: 57 %
FEV1-%Change-Post: 6 %
FEV1-%Pred-Post: 86 %
FEV1-%Pred-Pre: 81 %
FEV1-Post: 2.57 L
FEV1-Pre: 2.42 L
FEV1FVC-%Change-Post: 0 %
FEV1FVC-%Pred-Pre: 88 %
FEV6-%Change-Post: 8 %
FEV6-%Pred-Post: 99 %
FEV6-%Pred-Pre: 92 %
FEV6-Post: 3.63 L
FEV6-Pre: 3.34 L
FEV6FVC-%Change-Post: 0 %
FEV6FVC-%Pred-Post: 101 %
FEV6FVC-%Pred-Pre: 101 %
FVC-%Change-Post: 7 %
FVC-%Pred-Post: 98 %
FVC-%Pred-Pre: 91 %
FVC-Post: 3.65 L
FVC-Pre: 3.4 L
Post FEV1/FVC ratio: 71 %
Post FEV6/FVC ratio: 99 %
Pre FEV1/FVC ratio: 71 %
Pre FEV6/FVC Ratio: 99 %

## 2024-01-05 MED ORDER — ALBUTEROL SULFATE (2.5 MG/3ML) 0.083% IN NEBU
2.5000 mg | INHALATION_SOLUTION | Freq: Once | RESPIRATORY_TRACT | Status: AC
Start: 2024-01-05 — End: 2024-01-05
  Administered 2024-01-05: 2.5 mg via RESPIRATORY_TRACT

## 2024-01-09 NOTE — Progress Notes (Signed)
 Pt states her last inhaler prescribed she has not started yet due to her having one of her teeth extracted and she did not want it to get infected - but she will starte it when she gets that done.   Relayed results to pt and she confirmed her understanding

## 2024-01-31 ENCOUNTER — Ambulatory Visit (HOSPITAL_COMMUNITY)
Admission: RE | Admit: 2024-01-31 | Discharge: 2024-01-31 | Disposition: A | Discharge status: Home / Self Care | Source: Ambulatory Visit | Attending: Internal Medicine | Admitting: Internal Medicine

## 2024-01-31 DIAGNOSIS — J4489 Other specified chronic obstructive pulmonary disease: Secondary | ICD-10-CM | POA: Diagnosis present

## 2024-01-31 LAB — PULMONARY FUNCTION TEST
FEF 25-75 Post: 2.41 L/s
FEF 25-75 Pre: 1.68 L/s
FEF2575-%Change-Post: 43 %
FEF2575-%Pred-Post: 82 %
FEF2575-%Pred-Pre: 57 %
FEV1-%Change-Post: 11 %
FEV1-%Pred-Post: 91 %
FEV1-%Pred-Pre: 81 %
FEV1-Post: 2.71 L
FEV1-Pre: 2.43 L
FEV1FVC-%Change-Post: 0 %
FEV1FVC-%Pred-Pre: 88 %
FEV6-%Change-Post: 11 %
FEV6-%Pred-Post: 104 %
FEV6-%Pred-Pre: 93 %
FEV6-Post: 3.8 L
FEV6-Pre: 3.4 L
FEV6FVC-%Change-Post: 0 %
FEV6FVC-%Pred-Post: 101 %
FEV6FVC-%Pred-Pre: 102 %
FVC-%Change-Post: 12 %
FVC-%Pred-Post: 102 %
FVC-%Pred-Pre: 91 %
FVC-Post: 3.82 L
FVC-Pre: 3.41 L
Post FEV1/FVC ratio: 71 %
Post FEV6/FVC ratio: 99 %
Pre FEV1/FVC ratio: 71 %
Pre FEV6/FVC Ratio: 100 %

## 2024-01-31 MED ORDER — ALBUTEROL SULFATE (2.5 MG/3ML) 0.083% IN NEBU
2.5000 mg | INHALATION_SOLUTION | Freq: Once | RESPIRATORY_TRACT | Status: AC
  Administered 2024-01-31: 2.5 mg via RESPIRATORY_TRACT

## 2024-02-06 NOTE — Progress Notes (Signed)
 Called and relayed results to pt, pt confirmed understanding

## 2024-03-12 ENCOUNTER — Ambulatory Visit: Admitting: Internal Medicine

## 2024-03-12 ENCOUNTER — Encounter: Payer: Self-pay | Admitting: Internal Medicine

## 2024-03-12 VITALS — BP 116/74 | HR 73 | Ht 66.0 in | Wt 199.4 lb

## 2024-03-12 DIAGNOSIS — F17210 Nicotine dependence, cigarettes, uncomplicated: Secondary | ICD-10-CM

## 2024-03-12 DIAGNOSIS — J4489 Other specified chronic obstructive pulmonary disease: Secondary | ICD-10-CM

## 2024-03-12 NOTE — Progress Notes (Addendum)
 Lindsey Griffin, female    DOB: 01-21-1976    MRN: [id number]   Brief patient profile:  48  yowf active smoker  from [place]  referred to pulmonary clinic in [place]  12/12/2023 by [facility]  -  for doe/noct wheeze at dx of  pre-eclampsia/ hbp and chf around age 48 > [facility] eval rec a prn saba    Pt not previously seen by PCCM service.    History of Present Illness  12/12/2023  Pulmonary/ 1st office eval/ Lindsey Griffin / [place] [facility]  Chief Complaint  Patient presents with   Establish Care    Unable to breath for the interlock on vehicle  wheezing  Dyspnea:  couple of aisles slow/ pushing cart [employer]  Trouble playing with 47 y old at park wears her out  Cough: ok p 30 min spells each am > min mucoid production  Sleep: bed is flat/ 2 large pillows SABA use: couple times a day and every night around 3-4 AM since a year  02: none  Patient Instructions  Symbicort  80 Take 2 puffs first thing in am and then another 2 puffs about 12 hours later.  Work on inhaler technique:   Use your albuterol  as a rescue medication  PFTs here and in [place] Asap (2 sets spirometry only)  The key is to stop smoking completely before smoking completely stops you! Please remember to go to the lab department   for your tests - we will call you with the results when they are available.  - Alpha one AT phenotype 12/12/2023  MM  level 167 with EOS 0.9  - PFT's  01/03/2024  FEV1 2.57 (86 % ) ratio 0.71 p 6 % improvement from saba p ? Symbicort   prior to study with  FV curve very min concavity   - PFT's  01/31/24  FEV1 2.71 (91 % ) ratio 0.71  p 11 % improvement from saba p symbicort   prior to study    and FV curve min concave and ERV 13% at wt 197      03/12/2024  f/u ov/[place] office/Lindsey Griffin re: asthma  maint on symbicort  80  2bid  Chief Complaint  Patient presents with   Medical Management of Chronic Issues   ASTHMATIC BRONCHITIS    Less cough and wheezing since last visit.   Dyspnea:  limited by L hip  more than breathing / light headed with exertion x months Cough: better Sleeping: flat bed but bunch of pillows or wakes up choking with overt HB SABA use: 2 x daily  02: none      No obvious day to day or daytime variability or assoc excess/ purulent sputum or mucus plugs or hemoptysis or cp or chest tightness.     Also denies any obvious fluctuation of symptoms with weather or environmental changes or other aggravating or alleviating factors except as outlined above   No unusual exposure hx or h/o childhood pna/ asthma or knowledge of premature birth.  Current Allergies, Complete Past Medical History, Past Surgical History, Family History, and Social History were reviewed in [employer] record.  ROS  The following are not active complaints unless bolded Hoarseness, sore throat, dysphagia, dental problems, itching, sneezing,  nasal congestion or discharge of excess mucus or purulent secretions, ear ache,   fever, chills, sweats, unintended wt loss or wt gain, classically pleuritic or exertional cp,  orthopnea pnd or arm/hand swelling  or leg swelling, presyncope, palpitations, abdominal pain, anorexia, nausea, vomiting,  diarrhea  or change in bowel habits or change in bladder habits, change in stools or change in urine, dysuria, hematuria,  rash, arthralgias, visual complaints, headache, numbness, weakness or ataxia or problems with walking or coordination,  change in mood or  memory.         Outpatient Medications Prior to Visit  Medication Sig Dispense Refill   amLODipine  (NORVASC ) 5 MG tablet Take 5 mg by mouth daily.     atorvastatin  (LIPITOR) 10 MG tablet Take 10 mg by mouth daily.     budesonide -formoterol  (SYMBICORT ) 80-4.5 MCG/ACT inhaler Take 2 puffs first thing in am and then another 2 puffs about 12 hours later. 1 each 12   clonazePAM  (KLONOPIN ) 2 MG tablet Take 2 mg by mouth 4 (four) times daily.     Coenzyme Q10 (CO Q 10) 100 MG CAPS Take 100 mg by  mouth daily.      Erenumab-aooe (AIMOVIG) 70 MG/ML SOAJ Inject 140 mg into the skin every 30 (thirty) days.     HYDROcodone-acetaminophen  (HYCET) 7.5-325 mg/15 ml solution Take 10 mLs by mouth 4 (four) times daily as needed for moderate pain (pain score 4-6).     labetalol  (NORMODYNE ) 100 MG tablet Take 100 mg by mouth every evening.     levothyroxine  (SYNTHROID , LEVOTHROID) 100 MCG tablet Take 100 mcg by mouth daily before breakfast. (Patient taking differently: Take 135 mcg by mouth daily before breakfast.)     losartan  (COZAAR ) 100 MG tablet Take 50 mg by mouth every evening.     methocarbamol  (ROBAXIN ) 500 MG tablet Take 1 tablet (500 mg total) by mouth every 6 (six) hours as needed for muscle spasms. 30 tablet 2   nicotine  (NICODERM CQ  - DOSED IN MG/24 HR) 7 mg/24hr patch Apply 1 patch  daily 30 patch 0   omeprazole (PRILOSEC) 20 MG capsule Take 20 mg by mouth daily.     promethazine  (PHENERGAN ) 25 MG tablet Take 25 mg by mouth every 8 (eight) hours as needed for nausea or vomiting.      ranitidine (ZANTAC) 75 MG tablet Take 75 mg by mouth daily.     Rimegepant Sulfate (NURTEC PO) Take by mouth.     traMADol (ULTRAM) 50 MG tablet Take 100 mg by mouth 4 (four) times daily.     triamcinolone cream (KENALOG) 0.1 % Apply 1 application topically daily as needed (eczema).     TURMERIC PO Take by mouth daily.     Semaglutide, 1 MG/DOSE, (OZEMPIC, 1 MG/DOSE,) 2 MG/1.5ML SOPN Inject into the skin. (Patient not taking: Reported on 03/12/2024)     Facility-Administered Medications Prior to Visit  Medication Dose Route Frequency Provider Last Rate Last Admin   methylPREDNISolone  sodium succinate (SOLU-MEDROL ) 250 mg in sodium chloride  0.9 % 100 mL IVPB  250 mg Intravenous Continuous Jenel Carlin POUR, MD   250 mg at 02/08/13 1200         Please schedule a follow up visit in 3 months but call sooner if needed    Past Medical History:  Diagnosis Date   Anxiety and depression    Arthritis     Avascular necrosis of hip (HCC)    CHF (congestive heart failure) (HCC)    pt reports episode after birth of son in July 2017   GERD (gastroesophageal reflux disease)    Hypertension    Hypothyroidism    Migraine headache    Mild obesity    Pre-eclampsia    RLS (restless legs syndrome)  Objective:    wts   03/12/2024     199   12/12/23 186 lb 6.4 oz (84.6 kg)  10/21/22 160 lb (72.6 kg)  01/21/22 135 lb (61.2 kg)    Vital signs reviewed  03/12/2024  - Note at rest 02 sats  96% on RA   General appearance:    amb mod obese (by bmi) somwhat anxious wf nad      HEENT : Oropharynx  clear      Nasal turbinates nl    NECK :  without  apparent JVD/ palpable Nodes/TM    LUNGS: no acc muscle use,  Nl contour chest which is clear to A and P bilaterally without cough on insp or exp maneuvers   CV:  RRR  no s3 or murmur or increase in P2, and no edema   ABD:  soft and nontender   MS:  Gait nl   ext warm without deformities Or obvious joint restrictions  calf tenderness, cyanosis or clubbing    SKIN: warm and dry without lesions    NEURO:  alert, approp, nl sensorium with  no motor or cerebellar deficits apparent.       Assessment   Assessment & Plan Asthmatic bronchitis , chronic (HCC) Active smoker/MM - 12/12/2023  After extensive coaching inhaler device,  effectiveness =    75% (short ti)  > trial of symbicort  80 2bid  - Alpha one AT phenotype 12/12/2023  MM  level 167 with EOS 0.9  - PFT's  01/03/2024  FEV1 2.57 (86 % ) ratio 0.71 p 6 % improvement from saba p ? Symbicort   prior to study with  FV curve very min concavity   - PFT's  01/31/24  FEV1 2.71 (91 % ) ratio 0.71  p 11 % improvement from saba p symbicort   prior to study    and FV curve min concave and ERV 13% at wt 197   - 03/12/2024  After extensive coaching inhaler device,  effectiveness =   50% (short then long ti)   Despite suboptimal hfa All goals of chronic asthma control met including optimal function  and elimination of symptoms with minimal need for rescue therapy.  Contingencies discussed in full including contacting this office immediately if not controlling the symptoms using the rule of two's.     Her poor hfa even p coaching would indicate her asthma must be very mild but also limit  her ability to use an ignition lockout as she has trouble voluntarily controlling insp and exp efforts despite nl lung function studies.   Paperwork completed accordingly.   F/u can be prn   Re saba: Re SABA :  I spent extra time with pt today reviewing appropriate use of albuterol  for prn use on exertion with the following points: 1) saba is for relief of sob that does not improve by walking a slower pace or resting but rather if the pt does not improve after trying this first. 2) If the pt is convinced, as many are, that saba helps recover from activity faster then it's easy to tell if this is the case by re-challenging : ie stop, take the inhaler, then p 5 minutes try the exact same activity (intensity of workload) that just caused the symptoms and see if they are substantially diminished or not after saba 3) if there is an activity that reproducibly causes the symptoms, try the saba 15 min before the activity on alternate days   If in fact the  saba really does help, then fine to continue to use it prn but advised may need to look closer at the maintenance regimen (symbicort  80) being used to achieve better control of airways disease with exertion.   Each maintenance medication was reviewed in detail including emphasizing most importantly the difference between maintenance and prns and under what circumstances the prns are to be triggered using an action plan format where appropriate.  Total time for H and P, chart review, counseling, reviewing hfa  device(s) and generating customized AVS unique to this office visit / same day charting = 30 min        Cigarette smoker 5  min discussion re active  cigarette smoking in addition to office E&M  Ask about tobacco use:   ongoing  Advise quitting    I reviewed the Fletcher curve with the patient that basically indicates that  if you quit smoking when your best day FEV1 is still well preserved (as is clearly   the case here)  it is highly unlikely you will progress to severe disease and informed the patient there was  no medication on the market that has proven to alter the curve/ its downward trajectory  or the likelihood of progression of their disease(unlike other chronic medical conditions such as atheroclerosis where we do think we can change the natural hx with risk reducing meds)    Therefore stopping smoking and maintaining abstinence are  the most important aspects of her care, not choice of inhalers or for that matter, Pulmonary doctors.   Treatment other than smoking cessation  is entirely directed by severity of symptoms and focused also on reducing exacerbations, not attempting to change the natural history of the disease. Should either issue become problematic, then escalation of maintenance and prn resp meds may be warranted the punishment needs to fit the crime   Assess willingness:  Not committed at this point Assist in quit attempt:  Per PCP when ready Arrange follow up:   Follow up per Primary Care planned         AVS  Patient Instructions  The key is to stop smoking completely before smoking completely stops you!   No change symbicort  80 Take 2 puffs first thing in am and then another 2 puffs about 12 hours later.   Work on inhaler technique:  relax and gently blow all the way out then take a nice smooth full deep breath back in, triggering the inhaler at same time you start breathing in.  Hold breath in for at least  5 seconds if you can. Blow out symbicort  80  thru nose. Rinse and gargle with water when done.  If mouth or throat bother you at all,  try brushing teeth/gums/tongue with arm and hammer toothpaste/ make a  slurry and gargle and spit out.     Use your albuterol  as a rescue medication to be used if you can't catch your breath by resting, slowing your pace,  or doing a relaxed purse lip breathing pattern.  - The less you use it, the better it will work when you need it. - Ok to use up to 2 puffs  every 4 hours if you must but call for  appointment if use goes up over your usual need - Don't leave home without it !!  (think of it like a spare tire or starter fluid for your car)   Also  Ok to try albuterol  15 min before an activity (on alternating days)  that you know would usually make you short of breath and see if it makes any difference and if makes none then don't take albuterol  after activity unless you can't catch your breath as this means it's the resting that helps, not the albuterol .              Ozell America, MD 03/12/2024

## 2024-03-12 NOTE — Patient Instructions (Signed)
 The key is to stop smoking completely before smoking completely stops you!   No change symbicort  80 Take 2 puffs first thing in am and then another 2 puffs about 12 hours later.   Work on inhaler technique:  relax and gently blow all the way out then take a nice smooth full deep breath back in, triggering the inhaler at same time you start breathing in.  Hold breath in for at least  5 seconds if you can. Blow out symbicort  80  thru nose. Rinse and gargle with water when done.  If mouth or throat bother you at all,  try brushing teeth/gums/tongue with arm and hammer toothpaste/ make a slurry and gargle and spit out.     Use your albuterol  as a rescue medication to be used if you can't catch your breath by resting, slowing your pace,  or doing a relaxed purse lip breathing pattern.  - The less you use it, the better it will work when you need it. - Ok to use up to 2 puffs  every 4 hours if you must but call for  appointment if use goes up over your usual need - Don't leave home without it !!  (think of it like a spare tire or starter fluid for your car)   Also  Ok to try albuterol  15 min before an activity (on alternating days)  that you know would usually make you short of breath and see if it makes any difference and if makes none then don't take albuterol  after activity unless you can't catch your breath as this means it's the resting that helps, not the albuterol .

## 2024-03-12 NOTE — Assessment & Plan Note (Addendum)
 5  min discussion re active cigarette smoking in addition to office E&M  Ask about tobacco use:   ongoing  Advise quitting    I reviewed the Fletcher curve with the patient that basically indicates that  if you quit smoking when your best day FEV1 is still well preserved (as is clearly   the case here)  it is highly unlikely you will progress to severe disease and informed the patient there was  no medication on the market that has proven to alter the curve/ its downward trajectory  or the likelihood of progression of their disease(unlike other chronic medical conditions such as atheroclerosis where we do think we can change the natural hx with risk reducing meds)    Therefore stopping smoking and maintaining abstinence are  the most important aspects of her care, not choice of inhalers or for that matter, Pulmonary doctors.   Treatment other than smoking cessation  is entirely directed by severity of symptoms and focused also on reducing exacerbations, not attempting to change the natural history of the disease. Should either issue become problematic, then escalation of maintenance and prn resp meds may be warranted the punishment needs to fit the crime   Assess willingness:  Not committed at this point Assist in quit attempt:  Per PCP when ready Arrange follow up:   Follow up per Primary Care planned

## 2024-03-12 NOTE — Assessment & Plan Note (Addendum)
 Active smoker/MM - 12/12/2023  After extensive coaching inhaler device,  effectiveness =    75% (short ti)  > trial of symbicort  80 2bid  - Alpha one AT phenotype 12/12/2023  MM  level 167 with EOS 0.9  - PFT's  01/03/2024  FEV1 2.57 (86 % ) ratio 0.71 p 6 % improvement from saba p ? Symbicort   prior to study with  FV curve very min concavity   - PFT's  01/31/24  FEV1 2.71 (91 % ) ratio 0.71  p 11 % improvement from saba p symbicort   prior to study    and FV curve min concave and ERV 13% at wt 197   - 03/12/2024  After extensive coaching inhaler device,  effectiveness =   50% (short then long ti)   Despite suboptimal hfa All goals of chronic asthma control met including optimal function and elimination of symptoms with minimal need for rescue therapy.  Contingencies discussed in full including contacting this office immediately if not controlling the symptoms using the rule of two's.     Her poor hfa even p coaching would indicate her asthma must be very mild but also limit  her ability to use an ignition lockout as she has trouble voluntarily controlling insp and exp efforts despite nl lung function studies.   Paperwork completed accordingly.   F/u can be prn   Re saba: Re SABA :  I spent extra time with pt today reviewing appropriate use of albuterol  for prn use on exertion with the following points: 1) saba is for relief of sob that does not improve by walking a slower pace or resting but rather if the pt does not improve after trying this first. 2) If the pt is convinced, as many are, that saba helps recover from activity faster then it's easy to tell if this is the case by re-challenging : ie stop, take the inhaler, then p 5 minutes try the exact same activity (intensity of workload) that just caused the symptoms and see if they are substantially diminished or not after saba 3) if there is an activity that reproducibly causes the symptoms, try the saba 15 min before the activity on alternate  days   If in fact the saba really does help, then fine to continue to use it prn but advised may need to look closer at the maintenance regimen (symbicort  80) being used to achieve better control of airways disease with exertion.   Each maintenance medication was reviewed in detail including emphasizing most importantly the difference between maintenance and prns and under what circumstances the prns are to be triggered using an action plan format where appropriate.  Total time for H and P, chart review, counseling, reviewing hfa  device(s) and generating customized AVS unique to this office visit / same day charting = 30 min

## 2024-03-13 ENCOUNTER — Encounter: Payer: Self-pay | Admitting: Internal Medicine

## 2024-04-10 ENCOUNTER — Encounter: Payer: Self-pay | Admitting: Internal Medicine

## 2024-04-17 ENCOUNTER — Telehealth: Payer: Self-pay

## 2024-04-17 NOTE — Telephone Encounter (Signed)
 Patient scheduled.

## 2024-04-17 NOTE — Telephone Encounter (Signed)
 Ov asap with all meds in hand to regroup - this includes all meds from all doctors and otcs

## 2024-04-19 ENCOUNTER — Ambulatory Visit: Admitting: Internal Medicine

## 2024-04-19 ENCOUNTER — Encounter: Payer: Self-pay | Admitting: Internal Medicine

## 2024-04-19 VITALS — BP 100/66 | HR 107 | Ht 66.0 in | Wt 207.0 lb

## 2024-04-19 DIAGNOSIS — F17210 Nicotine dependence, cigarettes, uncomplicated: Secondary | ICD-10-CM

## 2024-04-19 DIAGNOSIS — J4489 Other specified chronic obstructive pulmonary disease: Secondary | ICD-10-CM | POA: Diagnosis not present

## 2024-04-19 MED ORDER — METHYLPREDNISOLONE ACETATE 80 MG/ML IJ SUSP
120.0000 mg | Freq: Once | INTRAMUSCULAR | Status: AC
  Administered 2024-04-19: 120 mg via INTRAMUSCULAR

## 2024-04-19 MED ORDER — MOMETASONE FURO-FORMOTEROL FUM 100-5 MCG/ACT IN AERO: INHALATION_SPRAY | RESPIRATORY_TRACT | 11 refills | Status: AC

## 2024-04-19 NOTE — Progress Notes (Unsigned)
 "   Lindsey Griffin, female    DOB: 09-10-1975    MRN: [id number]   Brief patient profile:  49  yowf active smoker  from [place]  referred to pulmonary clinic in [place]  12/12/2023 by [facility]  -  for doe/noct wheeze at dx of  pre-eclampsia/ hbp and chf around age 49 > [facility] eval rec a prn saba    Pt not previously seen by PCCM service.    History of Present Illness  12/12/2023  Pulmonary/ 1st office eval/ Nilah Belcourt / [place] [facility]  Chief Complaint  Patient presents with   Establish Care    Unable to breath for the interlock on vehicle  wheezing  Dyspnea:  couple of aisles slow/ pushing cart [employer]  Trouble playing with 21 y old at park wears her out  Cough: ok p 30 min spells each am > min mucoid production  Sleep: bed is flat/ 2 large pillows SABA use: couple times a day and every night around 3-4 AM since a year  02: none  Patient Instructions  Symbicort  80 Take 2 puffs first thing in am and then another 2 puffs about 12 hours later.  Work on inhaler technique:   Use your albuterol  as a rescue medication  PFTs here and in [place] Asap (2 sets spirometry only)  The key is to stop smoking completely before smoking completely stops you! Please remember to go to the lab department   for your tests - we will call you with the results when they are available.  - Alpha one AT phenotype 12/12/2023  MM  level 167 with EOS 0.9  - PFT's  01/03/2024  FEV1 2.57 (86 % ) ratio 0.71 p 6 % improvement from saba p ? Symbicort   prior to study with  FV curve very min concavity   - PFT's  01/31/24  FEV1 2.71 (91 % ) ratio 0.71  p 11 % improvement from saba p symbicort   prior to study    and FV curve min concave and ERV 13% at wt 197      03/12/2024  f/u ov/[place] office/Alessander Sikorski re: asthma  maint on symbicort  80  2bid  Chief Complaint  Patient presents with   Medical Management of Chronic Issues   ASTHMATIC BRONCHITIS    Less cough and wheezing since last visit.   Dyspnea:  limited by L hip  more than breathing / light headed with exertion x months Cough: better Sleeping: flat bed but bunch of pillows or wakes up choking with overt HB SABA use: 2 x daily  02: none   Patient Instructions  The key is to stop smoking completely before smoking completely stops you! No change symbicort  80 Take 2 puffs first thing in am and then another 2 puffs about 12 hours later.  Work on inhaler technique:  Use your albuterol  as a rescue medication  Also  Ok to try albuterol  15 min before an activity (on alternating days)  that you know would usually make you short of breat    04/19/2024 ACUTE  ov/[place] office/Yina Riviere re: AB/smoker maint on symbicort   but ran out about a week prior to OV  and can't fill it due to cost/ did bring all meds Chief Complaint  Patient presents with   Acute Visit    Asthmatic bronchitis Wheezing, unable to afford inhalers, bp med manage    Dyspnea:  back  limits more than sob  Cough: worse at hs and and having to sit upt  45 degrees  Sleeping: flat bed bunch of pillows lots of cough / wheeze    SABA use: twice daily        No obvious day to day or daytime variability or assoc excess/ purulent sputum or mucus plugs or hemoptysis or cp or chest tightness,  or  overt sinus or hb symptoms.    Also denies any obvious fluctuation of symptoms with weather or environmental changes or other aggravating or alleviating factors except as outlined above   No unusual exposure hx or h/o childhood pna/ asthma or knowledge of premature birth.  Current Allergies, Complete Past Medical History, Past Surgical History, Family History, and Social History were reviewed in [employer] record.  ROS  The following are not active complaints unless bolded Hoarseness, sore throat, dysphagia, dental problems, itching, sneezing,  nasal congestion or discharge of excess mucus or purulent secretions, ear ache,   fever, chills, sweats, unintended wt loss or  wt gain ,  classically pleuritic or exertional cp,  orthopnea pnd or arm/hand swelling  or leg swelling, presyncope, palpitations, abdominal pain, anorexia, nausea, vomiting, diarrhea  or change in bowel habits or change in bladder habits, change in stools or change in urine, dysuria, hematuria,  rash, arthralgias, visual complaints, headache, numbness, weakness or ataxia or problems with walking or coordination,  change in mood or  memory.         Outpatient Medications Prior to Visit  Medication Sig Dispense Refill   amLODipine  (NORVASC ) 5 MG tablet Take 5 mg by mouth daily.     atorvastatin  (LIPITOR) 10 MG tablet Take 10 mg by mouth daily.     budesonide -formoterol  (SYMBICORT ) 80-4.5 MCG/ACT inhaler Take 2 puffs first thing in am and then another 2 puffs about 12 hours later. 1 each 12   clonazePAM  (KLONOPIN ) 2 MG tablet Take 2 mg by mouth 4 (four) times daily.     Coenzyme Q10 (CO Q 10) 100 MG CAPS Take 100 mg by mouth daily.      Erenumab-aooe (AIMOVIG) 70 MG/ML SOAJ Inject 140 mg into the skin every 30 (thirty) days.     HYDROcodone-acetaminophen  (HYCET) 7.5-325 mg/15 ml solution Take 10 mLs by mouth 4 (four) times daily as needed for moderate pain (pain score 4-6).     labetalol  (NORMODYNE ) 100 MG tablet Take 100 mg by mouth every evening.     levothyroxine  (SYNTHROID , LEVOTHROID) 100 MCG tablet Take 100 mcg by mouth daily before breakfast. (Patient taking differently: Take 135 mcg by mouth daily before breakfast.)     losartan  (COZAAR ) 100 MG tablet Take 50 mg by mouth every evening.     methocarbamol  (ROBAXIN ) 500 MG tablet Take 1 tablet (500 mg total) by mouth every 6 (six) hours as needed for muscle spasms. 30 tablet 2   nicotine  (NICODERM CQ  - DOSED IN MG/24 HR) 7 mg/24hr patch Apply 1 patch  daily 30 patch 0   omeprazole (PRILOSEC) 20 MG capsule Take 20 mg by mouth daily.     promethazine  (PHENERGAN ) 25 MG tablet Take 25 mg by mouth every 8 (eight) hours as needed for nausea or vomiting.       ranitidine (ZANTAC) 75 MG tablet Take 75 mg by mouth daily.     Rimegepant Sulfate (NURTEC PO) Take by mouth.     Semaglutide, 1 MG/DOSE, (OZEMPIC, 1 MG/DOSE,) 2 MG/1.5ML SOPN Inject into the skin. (Patient not taking: Reported on 03/12/2024)     traMADol (ULTRAM) 50 MG tablet Take 100  mg by mouth 4 (four) times daily.     triamcinolone cream (KENALOG) 0.1 % Apply 1 application topically daily as needed (eczema).     TURMERIC PO Take by mouth daily.     Facility-Administered Medications Prior to Visit  Medication Dose Route Frequency Provider Last Rate Last Admin   methylPREDNISolone  sodium succinate (SOLU-MEDROL ) 250 mg in sodium chloride  0.9 % 100 mL IVPB  250 mg Intravenous Continuous Jenel Carlin POUR, MD   250 mg at 02/08/13 1200     Past Medical History:  Diagnosis Date   Anxiety and depression    Arthritis    Avascular necrosis of hip (HCC)    CHF (congestive heart failure) (HCC)    pt reports episode after birth of son in July 2017   GERD (gastroesophageal reflux disease)    Hypertension    Hypothyroidism    Migraine headache    Mild obesity    Pre-eclampsia    RLS (restless legs syndrome)       Objective:    wts   04/19/2024        207  03/12/2024     199   12/12/23 186 lb 6.4 oz (84.6 kg)  10/21/22 160 lb (72.6 kg)  01/21/22 135 lb (61.2 kg)    Vital signs reviewed  04/19/2024  - Note at rest 02 sats  95% on RA   General appearance:    mod obese (by BMI) amb frustrated (about her meds) wf nad    HEENT : Oropharynx  clear     Nasal turbinates nl    NECK :  without  apparent JVD/ palpable Nodes/TM    LUNGS: no acc muscle use,  Nl contour chest which is clear to A and P bilaterally without cough on insp or exp maneuvers   CV:  RRR  no s3 or murmur or increase in P2, and no edema   ABD:  soft and nontender   MS:  Gait nl   ext warm without deformities Or obvious joint restrictions  calf tenderness, cyanosis or clubbing    SKIN: warm and dry without  lesions    NEURO:  alert, approp, nl sensorium with  no motor or cerebellar deficits apparent.           Assessment   Assessment & Plan Asthmatic bronchitis , chronic (HCC) Active smoker/MM - 12/12/2023  After extensive coaching inhaler device,  effectiveness =    75% (short ti)  > trial of symbicort  80 2bid  - Alpha one AT phenotype 12/12/2023  MM  level 167 with EOS 0.9  - PFT's  01/03/2024  FEV1 2.57 (86 % ) ratio 0.71 p 6 % improvement from saba p ? Symbicort   prior to study with  FV curve very min concavity   - PFT's  01/31/24  FEV1 2.71 (91 % ) ratio 0.71  p 11 % improvement from saba p symbicort   prior to study    and FV curve min concave and ERV 13% at wt 197   - 03/12/2024  After extensive coaching inhaler device,  effectiveness =   50% (short then long ti)   - 04/19/2024  After extensive coaching inhaler device,  effectiveness =    75% hfa  >>> change to duler 100 if  covered and if not refer to  pharmacotherapy clinic for cheapest alternative     Cigarette smoker 4  min discussion re active cigarette smoking in addition to office E&M  Ask about tobacco use:  ongoing  Advise quitting   I took an extended  opportunity with this patient to outline the consequences of continued cigarette use  in airway disorders based on all the data we have from the multiple national lung health studies (perfomed over decades at millions of dollars in cost)  indicating that smoking cessation, not choice of inhalers or pulmonary physicians, is the most important aspect of her  care, esp as regards to cough control Assess willingness:  Not committed at this point Assist in quit attempt:  Per PCP when ready Arrange follow up:   Follow up per Primary Care planned      Each maintenance medication was reviewed in detail including emphasizing most importantly the difference between maintenance and prns and under what circumstances the prns are to be triggered using an action plan format where  appropriate.  Total time for H and P, chart review, counseling, reviewing hfa  device(s) and generating customized AVS unique to this office visit / same day charting = 25 min         AVS  Patient Instructions  Work on inhaler technique:  relax and gently blow all the way out then take a nice smooth full deep breath back in, triggering the inhaler at same time you start breathing in.  Hold breath in for at least  5 seconds if you can. Blow out symbicrt/dulera/breyna  thru nose. Rinse and gargle with water when done.  If mouth or throat bother you at all,  try brushing teeth/gums/tongue with arm and hammer toothpaste/ make a slurry and gargle and spit out.   Try dulera 100 (or Breyna  80)  Take 2 puffs first thing in am and then another 2 puffs about 12 hours later.    Depomedrol 120 mg IM   Keep previous follow up appts       Ozell America, MD 04/20/2024                          "

## 2024-04-19 NOTE — Patient Instructions (Addendum)
 Work on inhaler technique:  relax and gently blow all the way out then take a nice smooth full deep breath back in, triggering the inhaler at same time you start breathing in.  Hold breath in for at least  5 seconds if you can. Blow out symbicrt/dulera/breyna  thru nose. Rinse and gargle with water when done.  If mouth or throat bother you at all,  try brushing teeth/gums/tongue with arm and hammer toothpaste/ make a slurry and gargle and spit out.   Try dulera 100 (or Breyna  80)  Take 2 puffs first thing in am and then another 2 puffs about 12 hours later.    Depomedrol 120 mg IM   Keep previous follow up appts

## 2024-04-19 NOTE — Assessment & Plan Note (Signed)
 4  min discussion re active cigarette smoking in addition to office E&M  Ask about tobacco use:   ongoing  Advise quitting   I took an extended  opportunity with this patient to outline the consequences of continued cigarette use  in airway disorders based on all the data we have from the multiple national lung health studies (perfomed over decades at millions of dollars in cost)  indicating that smoking cessation, not choice of inhalers or pulmonary physicians, is the most important aspect of her  care, esp as regards to cough control Assess willingness:  Not committed at this point Assist in quit attempt:  Per PCP when ready Arrange follow up:   Follow up per Primary Care planned      Each maintenance medication was reviewed in detail including emphasizing most importantly the difference between maintenance and prns and under what circumstances the prns are to be triggered using an action plan format where appropriate.  Total time for H and P, chart review, counseling, reviewing hfa  device(s) and generating customized AVS unique to this office visit / same day charting = 25 min

## 2024-04-20 NOTE — Assessment & Plan Note (Addendum)
 Active smoker/MM - 12/12/2023  After extensive coaching inhaler device,  effectiveness =    75% (short ti)  > trial of symbicort  80 2bid  - Alpha one AT phenotype 12/12/2023  MM  level 167 with EOS 0.9  - PFT's  01/03/2024  FEV1 2.57 (86 % ) ratio 0.71 p 6 % improvement from saba p ? Symbicort   prior to study with  FV curve very min concavity   - PFT's  01/31/24  FEV1 2.71 (91 % ) ratio 0.71  p 11 % improvement from saba p symbicort   prior to study    and FV curve min concave and ERV 13% at wt 197   - 03/12/2024  After extensive coaching inhaler device,  effectiveness =   50% (short then long ti)   - 04/19/2024  After extensive coaching inhaler device,  effectiveness =    75% hfa  >>> change to duler 100 if  covered and if not refer to  pharmacotherapy clinic for cheapest alternative

## 2024-04-23 ENCOUNTER — Telehealth: Payer: Self-pay

## 2024-04-23 NOTE — Telephone Encounter (Signed)
 Received referral regarding inhaler cost assistance-  Cheapest alternative for Symbicort  80 pls  Routing to pharmacy team for non-specialty medication.

## 2024-04-25 ENCOUNTER — Other Ambulatory Visit (HOSPITAL_COMMUNITY): Payer: Self-pay
